# Patient Record
Sex: Female | Born: 1935 | Race: White | Hispanic: No | Smoking: Never smoker
Health system: Southern US, Community
[De-identification: ages and names within clinical notes are randomized; demographics above are authoritative.]

## PROBLEM LIST (undated history)

## (undated) DIAGNOSIS — I10 Essential (primary) hypertension: Secondary | ICD-10-CM

## (undated) HISTORY — PX: APPENDECTOMY: SHX54

---

## 2005-05-17 ENCOUNTER — Encounter: Admission: RE | Admit: 2005-05-17 | Discharge: 2005-05-17 | Payer: Self-pay | Admitting: Orthopedic Surgery

## 2005-07-03 ENCOUNTER — Encounter: Admission: RE | Admit: 2005-07-03 | Discharge: 2005-07-03 | Payer: Self-pay | Admitting: Orthopedic Surgery

## 2005-07-26 ENCOUNTER — Encounter: Admission: RE | Admit: 2005-07-26 | Discharge: 2005-07-26 | Payer: Self-pay | Admitting: Orthopedic Surgery

## 2005-08-09 ENCOUNTER — Encounter: Admission: RE | Admit: 2005-08-09 | Discharge: 2005-08-09 | Payer: Self-pay | Admitting: Orthopedic Surgery

## 2009-04-15 ENCOUNTER — Encounter: Admission: RE | Admit: 2009-04-15 | Discharge: 2009-04-15 | Payer: Self-pay | Admitting: Endocrinology

## 2014-11-24 DIAGNOSIS — H9113 Presbycusis, bilateral: Secondary | ICD-10-CM | POA: Diagnosis not present

## 2014-11-24 DIAGNOSIS — Z1211 Encounter for screening for malignant neoplasm of colon: Secondary | ICD-10-CM | POA: Diagnosis not present

## 2014-11-24 DIAGNOSIS — E780 Pure hypercholesterolemia: Secondary | ICD-10-CM | POA: Diagnosis not present

## 2014-11-24 DIAGNOSIS — Z79899 Other long term (current) drug therapy: Secondary | ICD-10-CM | POA: Diagnosis not present

## 2014-11-24 DIAGNOSIS — Z0000 Encounter for general adult medical examination without abnormal findings: Secondary | ICD-10-CM | POA: Diagnosis not present

## 2014-11-24 DIAGNOSIS — R1032 Left lower quadrant pain: Secondary | ICD-10-CM | POA: Diagnosis not present

## 2014-11-24 DIAGNOSIS — Z1389 Encounter for screening for other disorder: Secondary | ICD-10-CM | POA: Diagnosis not present

## 2014-11-24 DIAGNOSIS — R002 Palpitations: Secondary | ICD-10-CM | POA: Diagnosis not present

## 2014-11-24 DIAGNOSIS — I70219 Atherosclerosis of native arteries of extremities with intermittent claudication, unspecified extremity: Secondary | ICD-10-CM | POA: Diagnosis not present

## 2014-11-24 DIAGNOSIS — H8309 Labyrinthitis, unspecified ear: Secondary | ICD-10-CM | POA: Diagnosis not present

## 2014-11-24 DIAGNOSIS — R42 Dizziness and giddiness: Secondary | ICD-10-CM | POA: Diagnosis not present

## 2014-11-24 DIAGNOSIS — Z131 Encounter for screening for diabetes mellitus: Secondary | ICD-10-CM | POA: Diagnosis not present

## 2014-11-24 DIAGNOSIS — R5383 Other fatigue: Secondary | ICD-10-CM | POA: Diagnosis not present

## 2014-12-22 DIAGNOSIS — R319 Hematuria, unspecified: Secondary | ICD-10-CM | POA: Diagnosis not present

## 2014-12-22 DIAGNOSIS — I10 Essential (primary) hypertension: Secondary | ICD-10-CM | POA: Diagnosis not present

## 2015-08-04 ENCOUNTER — Ambulatory Visit
Admission: RE | Admit: 2015-08-04 | Discharge: 2015-08-04 | Disposition: A | Discharge status: Home / Self Care | Payer: Medicare Other | Source: Ambulatory Visit | Attending: Internal Medicine | Admitting: Internal Medicine

## 2015-08-04 ENCOUNTER — Other Ambulatory Visit: Payer: Self-pay | Admitting: Internal Medicine

## 2015-08-04 DIAGNOSIS — R0781 Pleurodynia: Secondary | ICD-10-CM

## 2015-08-04 DIAGNOSIS — R0789 Other chest pain: Secondary | ICD-10-CM

## 2015-11-29 ENCOUNTER — Other Ambulatory Visit (HOSPITAL_COMMUNITY): Payer: Self-pay | Admitting: Internal Medicine

## 2015-11-29 DIAGNOSIS — R0989 Other specified symptoms and signs involving the circulatory and respiratory systems: Secondary | ICD-10-CM

## 2015-11-30 ENCOUNTER — Ambulatory Visit (HOSPITAL_COMMUNITY)
Admission: RE | Admit: 2015-11-30 | Discharge: 2015-11-30 | Disposition: A | Discharge status: Home / Self Care | Payer: Medicare Other | Source: Ambulatory Visit | Attending: Internal Medicine | Admitting: Internal Medicine

## 2015-11-30 DIAGNOSIS — I6523 Occlusion and stenosis of bilateral carotid arteries: Secondary | ICD-10-CM | POA: Insufficient documentation

## 2015-11-30 DIAGNOSIS — R0989 Other specified symptoms and signs involving the circulatory and respiratory systems: Secondary | ICD-10-CM | POA: Insufficient documentation

## 2015-11-30 NOTE — Progress Notes (Signed)
*  PRELIMINARY RESULTS* Vascular Ultrasound Carotid Duplex (Doppler) has been completed.  There is no obvious evidence of hemodynamically significant internal carotid artery stenosis bilaterally. Vertebral arteries are patent with antegrade flow.  11/30/2015 2:32 PM Gertie Fey, RVT, RDCS, RDMS

## 2017-05-29 ENCOUNTER — Emergency Department (HOSPITAL_COMMUNITY): Payer: Medicare Other

## 2017-05-29 ENCOUNTER — Encounter (HOSPITAL_COMMUNITY): Payer: Self-pay | Admitting: *Deleted

## 2017-05-29 ENCOUNTER — Emergency Department (HOSPITAL_COMMUNITY)
Admission: EM | Admit: 2017-05-29 | Discharge: 2017-05-29 | Disposition: A | Discharge status: Home / Self Care | Payer: Medicare Other | Attending: Emergency Medicine | Admitting: Emergency Medicine

## 2017-05-29 DIAGNOSIS — R51 Headache: Secondary | ICD-10-CM | POA: Diagnosis not present

## 2017-05-29 DIAGNOSIS — W01190A Fall on same level from slipping, tripping and stumbling with subsequent striking against furniture, initial encounter: Secondary | ICD-10-CM | POA: Insufficient documentation

## 2017-05-29 DIAGNOSIS — Y939 Activity, unspecified: Secondary | ICD-10-CM | POA: Insufficient documentation

## 2017-05-29 DIAGNOSIS — M545 Low back pain: Secondary | ICD-10-CM | POA: Insufficient documentation

## 2017-05-29 DIAGNOSIS — S01312A Laceration without foreign body of left ear, initial encounter: Secondary | ICD-10-CM | POA: Diagnosis present

## 2017-05-29 DIAGNOSIS — Y92009 Unspecified place in unspecified non-institutional (private) residence as the place of occurrence of the external cause: Secondary | ICD-10-CM | POA: Insufficient documentation

## 2017-05-29 DIAGNOSIS — Y999 Unspecified external cause status: Secondary | ICD-10-CM | POA: Insufficient documentation

## 2017-05-29 DIAGNOSIS — W19XXXA Unspecified fall, initial encounter: Secondary | ICD-10-CM

## 2017-05-29 DIAGNOSIS — N3000 Acute cystitis without hematuria: Secondary | ICD-10-CM | POA: Diagnosis not present

## 2017-05-29 DIAGNOSIS — Z79899 Other long term (current) drug therapy: Secondary | ICD-10-CM | POA: Diagnosis not present

## 2017-05-29 LAB — CBC
HCT: 34.5 % — ABNORMAL LOW (ref 36.0–46.0)
Hemoglobin: 11.5 g/dL — ABNORMAL LOW (ref 12.0–15.0)
MCH: 30.5 pg (ref 26.0–34.0)
MCHC: 33.3 g/dL (ref 30.0–36.0)
MCV: 91.5 fL (ref 78.0–100.0)
Platelets: 210 10*3/uL (ref 150–400)
RBC: 3.77 MIL/uL — ABNORMAL LOW (ref 3.87–5.11)
RDW: 13.8 % (ref 11.5–15.5)
WBC: 16.1 10*3/uL — ABNORMAL HIGH (ref 4.0–10.5)

## 2017-05-29 LAB — BASIC METABOLIC PANEL
Anion gap: 8 (ref 5–15)
BUN: 28 mg/dL — ABNORMAL HIGH (ref 6–20)
CO2: 27 mmol/L (ref 22–32)
Calcium: 9.7 mg/dL (ref 8.9–10.3)
Chloride: 104 mmol/L (ref 101–111)
Creatinine, Ser: 0.94 mg/dL (ref 0.44–1.00)
GFR calc Af Amer: 59 mL/min — ABNORMAL LOW (ref >60)
GFR calc non Af Amer: 51 mL/min — ABNORMAL LOW (ref >60)
Glucose, Bld: 148 mg/dL — ABNORMAL HIGH (ref 65–99)
Potassium: 3.3 mmol/L — ABNORMAL LOW (ref 3.5–5.1)
Sodium: 139 mmol/L (ref 135–145)

## 2017-05-29 LAB — URINALYSIS, ROUTINE W REFLEX MICROSCOPIC
Bilirubin Urine: NEGATIVE
Glucose, UA: NEGATIVE mg/dL
Ketones, ur: NEGATIVE mg/dL
Nitrite: NEGATIVE
Protein, ur: >=300 mg/dL — AB
Specific Gravity, Urine: 1.017 (ref 1.005–1.030)
pH: 5 (ref 5.0–8.0)

## 2017-05-29 LAB — CK: Total CK: 2121 U/L — ABNORMAL HIGH (ref 38–234)

## 2017-05-29 MED ORDER — SODIUM CHLORIDE 0.9 % IV BOLUS (SEPSIS)
500.0000 mL | Freq: Once | INTRAVENOUS | Status: AC
  Administered 2017-05-29: 500 mL via INTRAVENOUS

## 2017-05-29 MED ORDER — CEPHALEXIN 500 MG PO CAPS: 500.0000 mg | ORAL_CAPSULE | Freq: Three times a day (TID) | ORAL | 0 refills | Status: DC

## 2017-05-29 MED ORDER — CEPHALEXIN 500 MG PO CAPS
500.0000 mg | ORAL_CAPSULE | Freq: Once | ORAL | Status: AC
  Administered 2017-05-29: 500 mg via ORAL
  Filled 2017-05-29: qty 1

## 2017-05-29 MED ORDER — DOXYCYCLINE HYCLATE 100 MG IV SOLR: 100.0000 mg | Freq: Once | INTRAVENOUS | Status: DC

## 2017-05-29 MED ORDER — LIDOCAINE HCL (PF) 1 % IJ SOLN
10.0000 mL | Freq: Once | INTRAMUSCULAR | Status: AC
  Administered 2017-05-29: 10 mL via INTRADERMAL
  Filled 2017-05-29: qty 30

## 2017-05-29 NOTE — ED Provider Notes (Signed)
[facility]-EMERGENCY DEPT Provider Note   CSN: [id number] Arrival date & time: 05/29/17  1317     History   Chief Complaint Chief Complaint  Patient presents with  . Fall    HPI Patricia Payne is a 83 y.o. female.  Patient with history of hypertension, accompanied by family today, with chief complaint of fall. Patient fell approximately midnight and struck her left ear on a TV stand. She states that she was too weak to get herself up. She lied on the floor on her right side for several hours. Patient states that typically she would be able to get herself up. She complains of lower back pain at the current time. She has dried blood from a cut on her left ear. Denies other acute complaints including chest pain, shortness of breath. No recent fevers, URI symptoms, vomiting, diarrhea. Family states that she was a bit more shaky than normal last night when they went out to eat. She also has been urinating more frequently recently. The onset of this condition was acute. The course is constant. Aggravating factors: none. Alleviating factors: none.        History reviewed. No pertinent past medical history.  There are no active problems to display for this patient.   History reviewed. No pertinent surgical history.  OB History    No data available       Home Medications    Prior to Admission medications   Medication Sig Start Date End Date Taking? Authorizing Provider  acetaminophen (TYLENOL) 500 MG tablet Take 1,000 mg by mouth every 6 (six) hours as needed for mild pain.   Yes [provider]  losartan (COZAAR) 25 MG tablet Take 25 mg by mouth daily. 05/09/17  Yes [provider]  Multiple Vitamin (MULTIVITAMIN WITH MINERALS) TABS tablet Take 2 tablets by mouth daily.   Yes [provider]  rOPINIRole (REQUIP) 1 MG tablet Take 1 mg by mouth at bedtime. 05/19/17  Yes [provider]  simvastatin (ZOCOR) 20 MG tablet Take 20 mg by mouth daily.  04/14/17  Yes [provider]  valsartan (DIOVAN) 320 MG tablet Take 320 mg by mouth daily. 03/15/17  Yes [provider]    Family History No family history on file.  Social History Social History  Substance Use Topics  . Smoking status: Never Smoker  . Smokeless tobacco: Never Used  . Alcohol use No     Allergies   Patient has no known allergies.   Review of Systems Review of Systems  Constitutional: Negative for fatigue and fever.  HENT: Negative for rhinorrhea, sore throat and tinnitus.   Eyes: Negative for photophobia, pain, redness and visual disturbance.  Respiratory: Negative for cough and shortness of breath.   Cardiovascular: Negative for chest pain.  Gastrointestinal: Negative for abdominal pain, diarrhea, nausea and vomiting.  Genitourinary: Positive for frequency. Negative for dysuria and hematuria.  Musculoskeletal: Positive for back pain. Negative for gait problem, myalgias and neck pain.  Skin: Positive for wound. Negative for rash.  Neurological: Positive for weakness (generalized). Negative for dizziness, light-headedness, numbness and headaches.  Psychiatric/Behavioral: Negative for confusion and decreased concentration.     Physical Exam Updated Vital Signs BP (!) 116/59 (BP Location: Right Arm)   Pulse 77   Temp 98.5 F (36.9 C) (Oral)   Resp 14   SpO2 93%   Physical Exam  Constitutional: She is oriented to person, place, and time. She appears well-developed and well-nourished.  HENT:  Head: Normocephalic.  Head is without raccoon's eyes and without Battle's sign.  Right Ear: Tympanic membrane, external ear and ear canal normal. No hemotympanum.  Left Ear: Tympanic membrane and ear canal normal. No hemotympanum.  Nose: Nose normal. No nasal septal hematoma.  Mouth/Throat: Uvula is midline, oropharynx is clear and moist and mucous membranes are normal.  Dried blood noted in left ear. Superficial laceration measuring about 2cm  noted overt antihelix. Partial avulsion. Base clean. Small area of laceration to cartilage but minor.   Eyes: Pupils are equal, round, and reactive to light. Conjunctivae, EOM and lids are normal. Right eye exhibits no discharge. Left eye exhibits no discharge. Right eye exhibits no nystagmus. Left eye exhibits no nystagmus.  No visible hyphema noted  Neck: Normal range of motion. Neck supple.  Cardiovascular: Normal rate, regular rhythm and normal heart sounds.   Pulmonary/Chest: Effort normal and breath sounds normal.  Abdominal: Soft. There is no tenderness.  Musculoskeletal:       Right shoulder: Normal.       Left shoulder: Normal.       Right elbow: Normal.      Left elbow: Normal.       Right wrist: Normal.       Left wrist: Normal.       Right hip: Normal.       Left hip: Normal.       Right knee: Normal.       Left knee: Normal.       Right ankle: Normal.       Left ankle: Normal.       Cervical back: She exhibits normal range of motion, no tenderness and no bony tenderness.       Thoracic back: She exhibits no tenderness and no bony tenderness.       Lumbar back: She exhibits tenderness. She exhibits no bony tenderness.  Neurological: She is alert and oriented to person, place, and time. She has normal strength and normal reflexes. No cranial nerve deficit or sensory deficit. Coordination normal. GCS eye subscore is 4. GCS verbal subscore is 5. GCS motor subscore is 6.  Skin: Skin is warm and dry.  Psychiatric: She has a normal mood and affect.  Nursing note and vitals reviewed.    ED Treatments / Results  Labs (all labs ordered are listed, but only abnormal results are displayed) Labs Reviewed  CBC - Abnormal; Notable for the following:       Result Value   WBC 16.1 (*)    RBC 3.77 (*)    Hemoglobin 11.5 (*)    HCT 34.5 (*)    All other components within normal limits  BASIC METABOLIC PANEL - Abnormal; Notable for the following:    Potassium 3.3 (*)    Glucose,  Bld 148 (*)    BUN 28 (*)    GFR calc non Af Amer 51 (*)    GFR calc Af Amer 59 (*)    All other components within normal limits  URINALYSIS, ROUTINE W REFLEX MICROSCOPIC - Abnormal; Notable for the following:    Color, Urine AMBER (*)    APPearance CLOUDY (*)    Hgb urine dipstick LARGE (*)    Protein, ur >=300 (*)    Leukocytes, UA LARGE (*)    Bacteria, UA MANY (*)    Squamous Epithelial / LPF 0-5 (*)    All other components within normal limits  CK - Abnormal; Notable for the following:    Total CK 2,121 (*)  All other components within normal limits  URINE CULTURE    EKG  EKG Interpretation None       Radiology Dg Lumbar Spine Complete  Result Date: 05/29/2017 CLINICAL DATA:  Fall. EXAM: LUMBAR SPINE - COMPLETE 4+ VIEW COMPARISON:  MRI 05/17/2005. FINDINGS: Lumbar vertebra numbered as per prior MRI. Diffuse multilevel degenerative change. 8 mm anterolisthesis L4 on L5 noted. Similar findings noted on prior study. Mild T12, L1, L2 compression fractures. Aortoiliac atherosclerotic vascular calcification. IMPRESSION: 1. Mild T12, L1, L2 compression fractures, age undetermined. These are new from prior MRI of 05/17/2005. 2. Diffuse multilevel degenerative change. 8 mm anterolisthesis L4 on L5 noted. Similar findings noted on prior study. 3. Aortic atherosclerotic vascular disease. Electronically Signed   By: Maisie Fus  Register   On: 05/29/2017 15:59   Ct Head Wo Contrast  Result Date: 05/29/2017 CLINICAL DATA:  Fall. EXAM: CT HEAD WITHOUT CONTRAST CT CERVICAL SPINE WITHOUT CONTRAST TECHNIQUE: Multidetector CT imaging of the head and cervical spine was performed following the standard protocol without intravenous contrast. Multiplanar CT image reconstructions of the cervical spine were also generated. COMPARISON:  None. FINDINGS: CT HEAD FINDINGS Brain: No evidence of acute infarction, hemorrhage, hydrocephalus, extra-axial collection or mass lesion/mass effect. Age-related cerebral  atrophy with compensatory dilatation of the ventricles. Mild periventricular white matter and corona radiata hypodensities favor chronic ischemic microvascular white matter disease. Vascular: No hyperdense vessel or unexpected calcification. Skull: Normal. Negative for fracture or focal lesion. Sinuses/Orbits: The bilateral paranasal sinuses and mastoid air cells are clear. Bilateral pseudophakia. The orbits otherwise unremarkable. Other: None. CT CERVICAL SPINE FINDINGS Alignment: Slightly accentuated normal cervical lordosis. Otherwise normal. Skull base and vertebrae: No acute fracture. No primary bone lesion or focal pathologic process. Soft tissues and spinal canal: No prevertebral fluid or swelling. No visible canal hematoma. Atherosclerotic vascular calcifications at the bilateral carotid bifurcations. Disc levels: Prominent atlantodental degenerative changes. Well corticated ossific density at the anterior base of the left C1 lateral mass is favored degenerative (series 11, image 41). Mild multilevel degenerative disc disease throughout the cervical spine, worst at C5-C6. Severe left-sided facet arthropathy. Mild uncovertebral hypertrophy at C5-C6. Upper chest: Small 3 mm ground-glass nodule in the left upper lobe otherwise negative. Other: None. IMPRESSION: 1.  No acute intracranial abnormality. 2. No acute cervical spine fracture. No prevertebral soft tissue swelling. 3. Mild multilevel degenerative disc disease and severe left-sided facet arthropathy throughout the cervical spine. Electronically Signed   By: Obie Dredge M.D.   On: 05/29/2017 16:37   Ct Cervical Spine Wo Contrast  Result Date: 05/29/2017 CLINICAL DATA:  Fall. EXAM: CT HEAD WITHOUT CONTRAST CT CERVICAL SPINE WITHOUT CONTRAST TECHNIQUE: Multidetector CT imaging of the head and cervical spine was performed following the standard protocol without intravenous contrast. Multiplanar CT image reconstructions of the cervical spine were also  generated. COMPARISON:  None. FINDINGS: CT HEAD FINDINGS Brain: No evidence of acute infarction, hemorrhage, hydrocephalus, extra-axial collection or mass lesion/mass effect. Age-related cerebral atrophy with compensatory dilatation of the ventricles. Mild periventricular white matter and corona radiata hypodensities favor chronic ischemic microvascular white matter disease. Vascular: No hyperdense vessel or unexpected calcification. Skull: Normal. Negative for fracture or focal lesion. Sinuses/Orbits: The bilateral paranasal sinuses and mastoid air cells are clear. Bilateral pseudophakia. The orbits otherwise unremarkable. Other: None. CT CERVICAL SPINE FINDINGS Alignment: Slightly accentuated normal cervical lordosis. Otherwise normal. Skull base and vertebrae: No acute fracture. No primary bone lesion or focal pathologic process. Soft tissues and spinal canal: No prevertebral fluid  or swelling. No visible canal hematoma. Atherosclerotic vascular calcifications at the bilateral carotid bifurcations. Disc levels: Prominent atlantodental degenerative changes. Well corticated ossific density at the anterior base of the left C1 lateral mass is favored degenerative (series 11, image 41). Mild multilevel degenerative disc disease throughout the cervical spine, worst at C5-C6. Severe left-sided facet arthropathy. Mild uncovertebral hypertrophy at C5-C6. Upper chest: Small 3 mm ground-glass nodule in the left upper lobe otherwise negative. Other: None. IMPRESSION: 1.  No acute intracranial abnormality. 2. No acute cervical spine fracture. No prevertebral soft tissue swelling. 3. Mild multilevel degenerative disc disease and severe left-sided facet arthropathy throughout the cervical spine. Electronically Signed   By: Obie Dredge M.D.   On: 05/29/2017 16:37    Procedures Procedures (including critical care time)  Medications Ordered in ED Medications - No data to display   Initial Impression / Assessment and  Plan / ED Course  I have reviewed the triage vital signs and the nursing notes.  Pertinent labs & imaging results that were available during my care of the patient were reviewed by me and considered in my medical decision making (see chart for details).     Patient seen and examined. Work-up initiated. D/w and seen by Dr. Clayborne Dana.   Vital signs reviewed and are as follows: BP (!) 116/59 (BP Location: Right Arm)   Pulse 77   Temp 98.5 F (36.9 C) (Oral)   Resp 14   SpO2 93%    Patient updated on results. Urine shows obvious urinary tract infection. Culture sent. Patient started on Keflex.  Left ear wound cleaned and evaluated. Due to tension on the laceration as well as thin skin, do not feel that suture repair will be possible. Wound reexamined with Dr. Clayborne Dana. After discussion with patient, decided that we will allow wound to heal by secondary intention.  Will discharge patient to home. Encouraged PCP follow-up in 3 days for urine and wound recheck. Patient counseled on wound care. Patient was urged to return to the Emergency Department urgently with worsening pain, swelling, expanding erythema especially if it streaks away from the affected area, fever, or if they have any other concerns. Patient verbalized understanding.   Encouraged patient to return to the emergency department with the symptoms or other concerns.  Final Clinical Impressions(s) / ED Diagnoses   Final diagnoses:  Acute cystitis without hematuria  Fall, initial encounter  Laceration of left ear, initial encounter   Patient with fall last evening. Head and neck imaging, lower back imaging are negative for acute injury. Patient has felt more weak over the past day. She has a urinary tract infection. She has an elevated CK, but less than 7000. She was hydrated in emergency department. No signs of acute kidney injury. Your laceration as described above. Do not feel that primary repair is indicated given technical  difficulties of wound repair along with time since injury.Will allow this to heal by secondary intention.  New Prescriptions New Prescriptions   CEPHALEXIN (KEFLEX) 500 MG CAPSULE    Take 1 capsule (500 mg total) by mouth 3 (three) times daily.     Renne Crigler, PA-C 05/29/17 2121    Marily Memos, MD 05/29/17 [phone]

## 2017-05-29 NOTE — ED Notes (Signed)
Patch and gauze applied to ear and secured with coban wrapped around pts head. Family instructed on dressing changes.

## 2017-05-29 NOTE — ED Provider Notes (Signed)
Medical screening examination/treatment/procedure(s) were conducted as a shared visit with non-physician practitioner(s) and myself.  I personally evaluated the patient during the encounter.  83 year old female who had a unwitnessed fall last night of uncertain etiology and today has some lower back pain and left head pain. On my exam she has of abrasion or small laceration to her left outer ear. No other trauma noticed on her head. Her back seemed to be tender in the lumbar area without any step-offs or deformities. Has not tried to walk since this episode but doesn't have any pain in her pelvis or legs that she knows of. Full muscle skeletal exam otherwise is unremarkable. Plan will be for CT scan and lumbar x-rays, weakness workup as she has had increasing weakness, shaking and polyuria recently. Possibly UTI versus dehydration and acute kidney injury. If everything is unremarkable patient is able to ambulate without difficulty she is stable for discharge home.    Marily Memos, MD 05/29/17 [phone]

## 2017-05-29 NOTE — Discharge Instructions (Signed)
Please read and follow all provided instructions.  Your diagnoses today include:  1. Acute cystitis without hematuria   2. Fall, initial encounter   3. Laceration of left ear, initial encounter     Tests performed today include:  Urine test - suggests that you have an infection in your bladder  Blood counts and electrolytes  CT scan of your head and neck - no serious injury  Vital signs. See below for your results today.   Medications prescribed:   Keflex (cephalexin) - antibiotic  You have been prescribed an antibiotic medicine: take the entire course of medicine even if you are feeling better. Stopping early can cause the antibiotic not to work.  Home care instructions:  Follow any educational materials contained in this packet.  Follow-up instructions: Please follow-up with your primary care provider in 3 days for recheck of your urine test and your ear laceration.  Return instructions:   Please return to the Emergency Department if you experience worsening symptoms.   Return with fever, worsening pain, persistent vomiting, worsening pain in your back.   Please return if you have any other emergent concerns.  Additional Information:  Your vital signs today were: BP (!) 185/68    Pulse 82    Temp 98.5 F (36.9 C) (Oral)    Resp (!) 21    SpO2 97%  If your blood pressure (BP) was elevated above 135/85 this visit, please have this repeated by your doctor within one month. --------------

## 2017-05-29 NOTE — ED Triage Notes (Signed)
Per EMS, pt fell ~midnight last night, hitting left ear on TV stand. Pt was unable to get off of floor. Pt is not on blood thinners, denies loss of consciousness. Pt has laceration to left ear.   BP 132/70 HR 70

## 2017-06-01 LAB — URINE CULTURE: Culture: >=100000 — AB

## 2017-06-02 ENCOUNTER — Telehealth: Payer: Self-pay

## 2017-06-02 NOTE — Telephone Encounter (Signed)
Post ED Visit - Positive Culture Follow-up  Culture report reviewed by antimicrobial stewardship pharmacist:  []  Enzo BiNathan Batchelder, Pharm.D. []  Celedonio MiyamotoJeremy Frens, Pharm.D., BCPS AQ-ID []  Garvin FilaMike Maccia, Pharm.D., BCPS []  Georgina PillionElizabeth Martin, Pharm.D., BCPS []  Silver FirsMinh Pham, VermontPharm.D., BCPS, AAHIVP []  Estella HuskMichelle Turner, Pharm.D., BCPS, AAHIVP []  Lysle Pearlachel Rumbarger, PharmD, BCPS []  Casilda Carlsaylor Stone, PharmD, BCPS []  Pollyann SamplesAndy Johnston, PharmD, BCPS [facility] Pharm D Positive urine culture Treated with Cephalexin, organism sensitive to the same and no further patient follow-up is required at this time.  Patricia Payne, Patricia Payne 06/02/2017, 11:13 AM

## 2017-08-26 ENCOUNTER — Emergency Department (HOSPITAL_COMMUNITY): Payer: Medicare Other

## 2017-08-26 ENCOUNTER — Other Ambulatory Visit: Payer: Self-pay

## 2017-08-26 ENCOUNTER — Encounter (HOSPITAL_COMMUNITY): Payer: Self-pay | Admitting: Emergency Medicine

## 2017-08-26 ENCOUNTER — Emergency Department (HOSPITAL_COMMUNITY)
Admission: EM | Admit: 2017-08-26 | Discharge: 2017-08-26 | Disposition: A | Discharge status: Home / Self Care | Payer: Medicare Other | Source: Home / Self Care | Attending: Emergency Medicine | Admitting: Emergency Medicine

## 2017-08-26 DIAGNOSIS — I10 Essential (primary) hypertension: Secondary | ICD-10-CM

## 2017-08-26 DIAGNOSIS — R531 Weakness: Secondary | ICD-10-CM

## 2017-08-26 DIAGNOSIS — M6281 Muscle weakness (generalized): Secondary | ICD-10-CM | POA: Insufficient documentation

## 2017-08-26 DIAGNOSIS — R0902 Hypoxemia: Secondary | ICD-10-CM | POA: Diagnosis not present

## 2017-08-26 DIAGNOSIS — Z79899 Other long term (current) drug therapy: Secondary | ICD-10-CM

## 2017-08-26 DIAGNOSIS — A419 Sepsis, unspecified organism: Secondary | ICD-10-CM | POA: Diagnosis not present

## 2017-08-26 HISTORY — DX: Essential (primary) hypertension: I10

## 2017-08-26 LAB — URINALYSIS, ROUTINE W REFLEX MICROSCOPIC
BACTERIA UA: NONE SEEN
Bilirubin Urine: NEGATIVE
Glucose, UA: 50 mg/dL — AB
Ketones, ur: 5 mg/dL — AB
Leukocytes, UA: NEGATIVE
Nitrite: NEGATIVE
PROTEIN: 100 mg/dL — AB
SPECIFIC GRAVITY, URINE: 1.018 (ref 1.005–1.030)
pH: 6 (ref 5.0–8.0)

## 2017-08-26 LAB — CBC
HEMATOCRIT: 33.4 % — AB (ref 36.0–46.0)
Hemoglobin: 10.7 g/dL — ABNORMAL LOW (ref 12.0–15.0)
MCH: 30 pg (ref 26.0–34.0)
MCHC: 32 g/dL (ref 30.0–36.0)
MCV: 93.6 fL (ref 78.0–100.0)
PLATELETS: 182 10*3/uL (ref 150–400)
RBC: 3.57 MIL/uL — AB (ref 3.87–5.11)
RDW: 13.9 % (ref 11.5–15.5)
WBC: 12 10*3/uL — AB (ref 4.0–10.5)

## 2017-08-26 LAB — COMPREHENSIVE METABOLIC PANEL
ALBUMIN: 3.9 g/dL (ref 3.5–5.0)
ALT: 28 U/L (ref 14–54)
ANION GAP: 10 (ref 5–15)
AST: 40 U/L (ref 15–41)
Alkaline Phosphatase: 90 U/L (ref 38–126)
BILIRUBIN TOTAL: 0.8 mg/dL (ref 0.3–1.2)
BUN: 20 mg/dL (ref 6–20)
CALCIUM: 9.4 mg/dL (ref 8.9–10.3)
CHLORIDE: 105 mmol/L (ref 101–111)
CO2: 26 mmol/L (ref 22–32)
Creatinine, Ser: 0.73 mg/dL (ref 0.44–1.00)
GFR calc non Af Amer: >60 mL/min (ref >60)
Glucose, Bld: 141 mg/dL — ABNORMAL HIGH (ref 65–99)
POTASSIUM: 3.1 mmol/L — AB (ref 3.5–5.1)
Sodium: 141 mmol/L (ref 135–145)
Total Protein: 7.7 g/dL (ref 6.5–8.1)

## 2017-08-26 LAB — INFLUENZA PANEL BY PCR (TYPE A & B)
INFLBPCR: NEGATIVE
Influenza A By PCR: NEGATIVE

## 2017-08-26 NOTE — ED Notes (Signed)
Bed: [facility]20 Expected date:  Expected time:  Means of arrival:  Comments: EMS 83 yo female daughter wants her evaluated for UTI

## 2017-08-26 NOTE — ED Notes (Signed)
[facility] call to transport the pt back home.

## 2017-08-26 NOTE — ED Notes (Signed)
Pt was unable to walk in room.  Pt was able to stand slouched over.

## 2017-08-26 NOTE — ED Triage Notes (Signed)
Patient BIB [facility] from home for weakness and possible UTI. Pt lives at home alone but sister stayed night with her due to increased weakness over last few days. Pt got up this am to go to bathroom, refused to go into bathroom and sat down on floor. Family unable to get pt up. Pt normally uses walker but unable to at this time. Pt confused at baseline per family.

## 2017-08-26 NOTE — ED Provider Notes (Signed)
[facility]-EMERGENCY DEPT Provider Note   CSN: [id number] Arrival date & time: 08/26/17  0347     History   Chief Complaint Chief Complaint  Patient presents with  . Weakness  . Urinary Tract Infection    HPI Ernst BreachMildred C Payne is a 83 y.o. female.  HPI Patient is a 83 year old female who is present with family who reports recurrent waxing and waning generalized weakness.  They state over the past 2 days she has had some increased weakness and today she sat on the floor and was unable to get up on her own.  She occasionally walks with a walker.  No reports of vomiting or diarrhea.  Appetite has been normal.  No fevers been noted.  Family is concerned about the possibility of a urinary tract infection.     Past Medical History:  Diagnosis Date  . Hypertension     There are no active problems to display for this patient.   Past Surgical History:  Procedure Laterality Date  . APPENDECTOMY      OB History    No data available       Home Medications    Prior to Admission medications   Medication Sig Start Date End Date Taking? Authorizing Provider  acetaminophen (TYLENOL) 500 MG tablet Take 1,000 mg by mouth every 6 (six) hours as needed for mild pain.   Yes [provider]  ALPRAZolam (XANAX) 0.25 MG tablet Take 0.125-0.25 mg by mouth 3 (three) times daily. 1/2 tablet for breakfast and lunch, 1 tablet for dinner 08/21/17  Yes [provider]  losartan (COZAAR) 25 MG tablet Take 25 mg by mouth daily. 05/09/17  Yes [provider]  rOPINIRole (REQUIP) 1 MG tablet Take 1 mg by mouth at bedtime. 05/19/17  Yes [provider]  simvastatin (ZOCOR) 20 MG tablet Take 20 mg by mouth daily. 04/14/17  Yes [provider]  traMADol (ULTRAM) 50 MG tablet Take 50 mg by mouth 2 (two) times daily. 08/21/17  Yes [provider]  valsartan (DIOVAN) 320 MG tablet Take 320 mg by mouth daily. 03/15/17  Yes [provider]  cephALEXin (KEFLEX) 500 MG capsule Take 1 capsule (500 mg total) by mouth 3 (three) times daily. Patient not taking: Reported on 08/26/2017 05/29/17   Renne CriglerGeiple, Joshua, PA-C    Family History History reviewed. No pertinent family history.  Social History Social History   Tobacco Use  . Smoking status: Never Smoker  . Smokeless tobacco: Never Used  Substance Use Topics  . Alcohol use: No  . Drug use: No     Allergies   Patient has no known allergies.   Review of Systems Review of Systems  All other systems reviewed and are negative.    Physical Exam Updated Vital Signs BP (!) 204/82   Pulse (!) 49   Temp 98.6 F (37 C) (Oral)   Resp 18   SpO2 98%   Physical Exam  Constitutional: She is oriented to person, place, and time. She appears well-developed and well-nourished. No distress.  HENT:  Head: Normocephalic and atraumatic.  Eyes: EOM are normal.  Neck: Normal range of motion.  Cardiovascular: Normal rate and regular rhythm.  Pulmonary/Chest: Effort normal and breath sounds normal.  Abdominal: Soft. She exhibits no distension. There is no tenderness.  Musculoskeletal: Normal range of motion. She exhibits no edema or tenderness.  Neurological: She is alert and oriented to person, place, and time.  Normal motor strength in bilateral  upper and lower extremity major muscle groups  Skin: Skin is warm and dry.  Psychiatric: She has a normal mood and affect. Judgment normal.  Nursing note and vitals reviewed.    ED Treatments / Results  Labs (all labs ordered are listed, but only abnormal results are displayed) Labs Reviewed  URINALYSIS, ROUTINE W REFLEX MICROSCOPIC - Abnormal; Notable for the following components:      Result Value   Glucose, UA 50 (*)    Hgb urine dipstick SMALL (*)    Ketones, ur 5 (*)    Protein, ur 100 (*)    Squamous Epithelial / LPF 0-5 (*)    All other components within normal limits  CBC - Abnormal; Notable for the  following components:   WBC 12.0 (*)    RBC 3.57 (*)    Hemoglobin 10.7 (*)    HCT 33.4 (*)    All other components within normal limits  COMPREHENSIVE METABOLIC PANEL - Abnormal; Notable for the following components:   Potassium 3.1 (*)    Glucose, Bld 141 (*)    All other components within normal limits  INFLUENZA PANEL BY PCR (TYPE A & B)    EKG  EKG Interpretation None       Radiology Dg Chest 2 View  Result Date: 08/26/2017 CLINICAL DATA:  Weakness. EXAM: CHEST  2 VIEW COMPARISON:  08/04/2015 FINDINGS: Shallow inspiration with linear atelectasis in the lung bases. Cardiac enlargement with diffuse pulmonary vascular congestion. Perihilar hazy infiltrates consistent with edema. No focal consolidation. No blunting of costophrenic angles. No pneumothorax. Degenerative changes in the spine. Calcification of the aorta. IMPRESSION: Shallow inspiration with atelectasis in the lung bases. Congestive changes with cardiac enlargement, pulmonary vascular congestion, and perihilar edema. Electronically Signed   By: Burman Nieves M.D.   On: 08/26/2017 05:42    Procedures Procedures (including critical care time)  Medications Ordered in ED Medications - No data to display   Initial Impression / Assessment and Plan / ED Course  I have reviewed the triage vital signs and the nursing notes.  Pertinent labs & imaging results that were available during my care of the patient were reviewed by me and considered in my medical decision making (see chart for details).     No significant abnormalities noted.  It sounds that the patient has had waxing and waning issues with ambulation.  Sometimes she walks with a rolling walker sometimes she does not.  I think the patient will benefit from home health resources.  Consult has been placed electronically to case management to reach out to the family.  I have done the face-to-face examination necessary for home health PT, OT, RN, aide, social  worker.  She may end up benefiting from a short stay at rehab and/or assisted living.  This will be best assessed as an outpatient in the home setting by the home health team's.  Labs and urine without significant abnormality.  Chest x-ray without pneumonia.  Plan for discharge home in good condition.  Family is currently staying with her to provide the necessary help at home  Final Clinical Impressions(s) / ED Diagnoses   Final diagnoses:  Generalized weakness    ED Discharge Orders        Ordered    Home Health     08/26/17 0710    Face-to-face encounter (required for Medicare/Medicaid patients)    Comments:  I Azalia Bilis certify that this patient is under my care and that I, or a  nurse practitioner or physician's assistant working with me, had a face-to-face encounter that meets the physician face-to-face encounter requirements with this patient on 08/26/2017. The encounter with the patient was in whole, or in part for the following medical condition(s) which is the primary reason for home health care (List medical condition): increasing weakness and fallls. gait instability   08/26/17 0710       Azalia Bilisampos, Keali Mccraw, MD 08/26/17 [phone]

## 2017-08-27 ENCOUNTER — Encounter (HOSPITAL_COMMUNITY): Payer: Self-pay | Admitting: Emergency Medicine

## 2017-08-27 ENCOUNTER — Inpatient Hospital Stay (HOSPITAL_COMMUNITY)
Admission: EM | Admit: 2017-08-27 | Discharge: 2017-09-03 | DRG: 871 | Disposition: A | Discharge status: Skilled Nursing Facility | Payer: Medicare Other | Attending: Internal Medicine | Admitting: Internal Medicine

## 2017-08-27 ENCOUNTER — Emergency Department (HOSPITAL_COMMUNITY): Payer: Medicare Other

## 2017-08-27 ENCOUNTER — Other Ambulatory Visit: Payer: Self-pay

## 2017-08-27 ENCOUNTER — Telehealth: Payer: Self-pay | Admitting: Emergency Medicine

## 2017-08-27 DIAGNOSIS — J189 Pneumonia, unspecified organism: Secondary | ICD-10-CM | POA: Diagnosis present

## 2017-08-27 DIAGNOSIS — A419 Sepsis, unspecified organism: Principal | ICD-10-CM | POA: Diagnosis present

## 2017-08-27 DIAGNOSIS — I6523 Occlusion and stenosis of bilateral carotid arteries: Secondary | ICD-10-CM | POA: Diagnosis present

## 2017-08-27 DIAGNOSIS — G2581 Restless legs syndrome: Secondary | ICD-10-CM | POA: Diagnosis present

## 2017-08-27 DIAGNOSIS — I447 Left bundle-branch block, unspecified: Secondary | ICD-10-CM | POA: Diagnosis present

## 2017-08-27 DIAGNOSIS — E876 Hypokalemia: Secondary | ICD-10-CM

## 2017-08-27 DIAGNOSIS — D649 Anemia, unspecified: Secondary | ICD-10-CM | POA: Diagnosis present

## 2017-08-27 DIAGNOSIS — R0902 Hypoxemia: Secondary | ICD-10-CM

## 2017-08-27 DIAGNOSIS — M549 Dorsalgia, unspecified: Secondary | ICD-10-CM

## 2017-08-27 DIAGNOSIS — Z66 Do not resuscitate: Secondary | ICD-10-CM | POA: Diagnosis present

## 2017-08-27 DIAGNOSIS — G9341 Metabolic encephalopathy: Secondary | ICD-10-CM | POA: Diagnosis present

## 2017-08-27 DIAGNOSIS — Z8249 Family history of ischemic heart disease and other diseases of the circulatory system: Secondary | ICD-10-CM

## 2017-08-27 DIAGNOSIS — E785 Hyperlipidemia, unspecified: Secondary | ICD-10-CM | POA: Diagnosis present

## 2017-08-27 DIAGNOSIS — I10 Essential (primary) hypertension: Secondary | ICD-10-CM | POA: Diagnosis present

## 2017-08-27 DIAGNOSIS — G9340 Encephalopathy, unspecified: Secondary | ICD-10-CM | POA: Diagnosis present

## 2017-08-27 DIAGNOSIS — K5909 Other constipation: Secondary | ICD-10-CM | POA: Diagnosis present

## 2017-08-27 DIAGNOSIS — R4182 Altered mental status, unspecified: Secondary | ICD-10-CM

## 2017-08-27 DIAGNOSIS — R627 Adult failure to thrive: Secondary | ICD-10-CM | POA: Diagnosis present

## 2017-08-27 DIAGNOSIS — I4891 Unspecified atrial fibrillation: Secondary | ICD-10-CM

## 2017-08-27 DIAGNOSIS — Z79891 Long term (current) use of opiate analgesic: Secondary | ICD-10-CM

## 2017-08-27 DIAGNOSIS — I480 Paroxysmal atrial fibrillation: Secondary | ICD-10-CM | POA: Diagnosis present

## 2017-08-27 DIAGNOSIS — F419 Anxiety disorder, unspecified: Secondary | ICD-10-CM | POA: Diagnosis present

## 2017-08-27 DIAGNOSIS — W19XXXA Unspecified fall, initial encounter: Secondary | ICD-10-CM

## 2017-08-27 LAB — DIFFERENTIAL
Basophils Absolute: 0 10*3/uL (ref 0.0–0.1)
Basophils Relative: 0 %
Eosinophils Absolute: 0 10*3/uL (ref 0.0–0.7)
Eosinophils Relative: 0 %
LYMPHS PCT: 12 %
Lymphs Abs: 1.3 10*3/uL (ref 0.7–4.0)
MONO ABS: 1.5 10*3/uL — AB (ref 0.1–1.0)
Monocytes Relative: 14 %
NEUTROS ABS: 8.3 10*3/uL — AB (ref 1.7–7.7)
NEUTROS PCT: 74 %

## 2017-08-27 LAB — COMPREHENSIVE METABOLIC PANEL
ALBUMIN: 2.6 g/dL — AB (ref 3.5–5.0)
ALK PHOS: 80 U/L (ref 38–126)
ALT: 23 U/L (ref 14–54)
AST: 49 U/L — AB (ref 15–41)
Anion gap: 10 (ref 5–15)
BUN: 17 mg/dL (ref 6–20)
CALCIUM: 8.8 mg/dL — AB (ref 8.9–10.3)
CO2: 24 mmol/L (ref 22–32)
CREATININE: 0.72 mg/dL (ref 0.44–1.00)
Chloride: 102 mmol/L (ref 101–111)
GFR calc Af Amer: >60 mL/min (ref >60)
GFR calc non Af Amer: >60 mL/min (ref >60)
GLUCOSE: 126 mg/dL — AB (ref 65–99)
Potassium: 3.9 mmol/L (ref 3.5–5.1)
SODIUM: 136 mmol/L (ref 135–145)
Total Bilirubin: 2.2 mg/dL — ABNORMAL HIGH (ref 0.3–1.2)
Total Protein: 6.1 g/dL — ABNORMAL LOW (ref 6.5–8.1)

## 2017-08-27 LAB — URINALYSIS, ROUTINE W REFLEX MICROSCOPIC
Bilirubin Urine: NEGATIVE
GLUCOSE, UA: NEGATIVE mg/dL
Ketones, ur: 5 mg/dL — AB
LEUKOCYTES UA: NEGATIVE
Nitrite: NEGATIVE
SPECIFIC GRAVITY, URINE: 1.02 (ref 1.005–1.030)
pH: 6 (ref 5.0–8.0)

## 2017-08-27 LAB — I-STAT CHEM 8, ED
BUN: 23 mg/dL — ABNORMAL HIGH (ref 6–20)
CREATININE: 0.6 mg/dL (ref 0.44–1.00)
Calcium, Ion: 1.12 mmol/L — ABNORMAL LOW (ref 1.15–1.40)
Chloride: 103 mmol/L (ref 101–111)
Glucose, Bld: 130 mg/dL — ABNORMAL HIGH (ref 65–99)
HEMATOCRIT: 35 % — AB (ref 36.0–46.0)
HEMOGLOBIN: 11.9 g/dL — AB (ref 12.0–15.0)
POTASSIUM: 3.9 mmol/L (ref 3.5–5.1)
Sodium: 139 mmol/L (ref 135–145)
TCO2: 29 mmol/L (ref 22–32)

## 2017-08-27 LAB — CBC
HCT: 34.8 % — ABNORMAL LOW (ref 36.0–46.0)
HEMATOCRIT: 29.8 % — AB (ref 36.0–46.0)
HEMOGLOBIN: 9.9 g/dL — AB (ref 12.0–15.0)
Hemoglobin: 11.6 g/dL — ABNORMAL LOW (ref 12.0–15.0)
MCH: 30.5 pg (ref 26.0–34.0)
MCH: 30.4 pg (ref 26.0–34.0)
MCHC: 33.3 g/dL (ref 30.0–36.0)
MCHC: 33.2 g/dL (ref 30.0–36.0)
MCV: 91.6 fL (ref 78.0–100.0)
MCV: 91.4 fL (ref 78.0–100.0)
PLATELETS: 205 10*3/uL (ref 150–400)
Platelets: 180 10*3/uL (ref 150–400)
RBC: 3.8 MIL/uL — AB (ref 3.87–5.11)
RBC: 3.26 MIL/uL — AB (ref 3.87–5.11)
RDW: 13.9 % (ref 11.5–15.5)
RDW: 13.7 % (ref 11.5–15.5)
WBC: 11.1 10*3/uL — AB (ref 4.0–10.5)
WBC: 9.7 10*3/uL (ref 4.0–10.5)

## 2017-08-27 LAB — CREATININE, SERUM: Creatinine, Ser: 0.82 mg/dL (ref 0.44–1.00)

## 2017-08-27 LAB — I-STAT TROPONIN, ED: Troponin i, poc: 0.11 ng/mL (ref 0.00–0.08)

## 2017-08-27 LAB — PROTIME-INR
INR: 1.19
PROTHROMBIN TIME: 15 s (ref 11.4–15.2)

## 2017-08-27 LAB — AMMONIA: AMMONIA: 24 umol/L (ref 9–35)

## 2017-08-27 LAB — APTT: aPTT: 27 seconds (ref 24–36)

## 2017-08-27 LAB — I-STAT CG4 LACTIC ACID, ED: LACTIC ACID, VENOUS: 1.4 mmol/L (ref 0.5–1.9)

## 2017-08-27 LAB — BRAIN NATRIURETIC PEPTIDE: B Natriuretic Peptide: 555.4 pg/mL — ABNORMAL HIGH (ref 0.0–100.0)

## 2017-08-27 LAB — ETHANOL: Alcohol, Ethyl (B): <10 mg/dL (ref <10)

## 2017-08-27 MED ORDER — ACETAMINOPHEN 650 MG RE SUPP: 650.0000 mg | RECTAL | Status: DC | PRN

## 2017-08-27 MED ORDER — STROKE: EARLY STAGES OF RECOVERY BOOK
Freq: Once | Status: AC
  Administered 2017-08-28: 03:00:00
  Filled 2017-08-27: qty 1

## 2017-08-27 MED ORDER — SODIUM CHLORIDE 0.9 % IV SOLN
INTRAVENOUS | Status: DC
  Administered 2017-08-27: 22:00:00 via INTRAVENOUS

## 2017-08-27 MED ORDER — ACETAMINOPHEN 160 MG/5ML PO SOLN: 650.0000 mg | ORAL | Status: DC | PRN

## 2017-08-27 MED ORDER — DEXTROSE 5 % IV SOLN
1.0000 g | Freq: Once | INTRAVENOUS | Status: AC
  Administered 2017-08-27: 1 g via INTRAVENOUS
  Filled 2017-08-27: qty 10

## 2017-08-27 MED ORDER — ACETAMINOPHEN 325 MG PO TABS
650.0000 mg | ORAL_TABLET | ORAL | Status: DC | PRN
  Administered 2017-09-03: 650 mg via ORAL
  Filled 2017-08-27: qty 2

## 2017-08-27 MED ORDER — ENOXAPARIN SODIUM 40 MG/0.4ML ~~LOC~~ SOLN: 40.0000 mg | Freq: Every day | SUBCUTANEOUS | Status: DC

## 2017-08-27 MED ORDER — DOXYCYCLINE HYCLATE 100 MG IV SOLR
100.0000 mg | Freq: Once | INTRAVENOUS | Status: AC
  Administered 2017-08-27: 100 mg via INTRAVENOUS
  Filled 2017-08-27: qty 100

## 2017-08-27 MED ORDER — HYDRALAZINE HCL 20 MG/ML IJ SOLN: 10.0000 mg | INTRAMUSCULAR | Status: DC | PRN

## 2017-08-27 MED ORDER — AZITHROMYCIN 500 MG IV SOLR
500.0000 mg | INTRAVENOUS | Status: DC
  Administered 2017-08-28 – 2017-08-29 (×3): 500 mg via INTRAVENOUS
  Filled 2017-08-27 (×4): qty 500

## 2017-08-27 MED ORDER — DEXTROSE 5 % IV SOLN
1.0000 g | INTRAVENOUS | Status: DC
  Administered 2017-08-28 – 2017-09-01 (×5): 1 g via INTRAVENOUS
  Filled 2017-08-27 (×8): qty 10

## 2017-08-27 NOTE — ED Provider Notes (Addendum)
[facility] Provider Note   CSN: [id number] Arrival date & time: 08/27/17  1745     History   Chief Complaint Chief Complaint  Patient presents with  . Altered Mental Status    HPI Patricia Payne is a 83 y.o. female.  83 year old female with history of hypertension who presents with generalized weakness and altered mental status.  [facility] for generalized weakness particularly in her legs.  They were concerned about the possibility of a urinary tract infection but her workup yesterday was normal.  She was discharged home and they state that she has become progressively more lethargic, confused, and with slurred speech.  She has had increased falls because of her generalized weakness and has not been able to stand up on her own.  They have noted a mild occasional cough, no known fevers.  They felt like she looked short of breath last night but this eventually resolved spontaneously.  She has not had any vomiting.  They note that she is normally oriented x3 and conversant. She has occasionally complained of back pain but this is a chronic problem for her.  LEVEL 5 CAVEAT DUE TO AMS   The history is provided by a relative.  Altered Mental Status      Past Medical History:  Diagnosis Date  . Hypertension     There are no active problems to display for this patient.   Past Surgical History:  Procedure Laterality Date  . APPENDECTOMY      OB History    No data available       Home Medications    Prior to Admission medications   Medication Sig Start Date End Date Taking? Authorizing Provider  acetaminophen (TYLENOL) 500 MG tablet Take 1,000 mg by mouth every 6 (six) hours as needed for mild pain.   Yes [provider]  ALPRAZolam (XANAX) 0.25 MG tablet Take 0.125-0.25 mg by mouth See admin instructions. 0.125mg  in morning and afternoon, then 0.25mg  at  bedtime 08/21/17  Yes [provider]  losartan (COZAAR) 25 MG tablet Take 25 mg by mouth daily. 05/09/17  Yes [provider]  rOPINIRole (REQUIP) 1 MG tablet Take 1 mg by mouth at bedtime. 05/19/17  Yes [provider]  simvastatin (ZOCOR) 20 MG tablet Take 20 mg by mouth daily. 04/14/17  Yes [provider]  traMADol (ULTRAM) 50 MG tablet Take 50 mg by mouth 2 (two) times daily. 08/21/17  Yes [provider]  valsartan (DIOVAN) 320 MG tablet Take 320 mg by mouth daily. 03/15/17  Yes [provider]  cephALEXin (KEFLEX) 500 MG capsule Take 1 capsule (500 mg total) by mouth 3 (three) times daily. Patient not taking: Reported on 08/26/2017 05/29/17   Renne CriglerGeiple, Joshua, PA-C    Family History History reviewed. No pertinent family history.  Social History Social History   Tobacco Use  . Smoking status: Never Smoker  . Smokeless tobacco: Never Used  Substance Use Topics  . Alcohol use: No  . Drug use: No     Allergies   Patient has no known allergies.   Review of Systems Review of Systems  Unable to perform ROS: Mental status change    Physical Exam Updated Vital Signs BP (!) 130/43   Pulse 71   Resp (!) 34   Ht 5\' 4"  (1.626 m)   Wt 61.2 kg (135 lb)   SpO2 99%   BMI  23.17 kg/m   Physical Exam  Constitutional: She appears well-developed and well-nourished.  Altered, ill appearing but non-toxic  HENT:  Head: Normocephalic and atraumatic.  dry mucous membranes  Eyes: Conjunctivae are normal. Pupils are equal, round, and reactive to light.  Neck: Neck supple.  Cardiovascular: Normal rate, regular rhythm and normal heart sounds.  No murmur heard. Pulmonary/Chest: Effort normal.  Diminished b/l with crackles in LLL  Abdominal: Soft. Bowel sounds are normal. She exhibits no distension. There is no tenderness.  Musculoskeletal: She exhibits no edema.  Neurological: She is alert.  Disoriented, able to follow a few basic  commands but sleepy  Skin: Skin is warm and dry.  Nursing note and vitals reviewed.    ED Treatments / Results  Labs (all labs ordered are listed, but only abnormal results are displayed) Labs Reviewed  CBC - Abnormal; Notable for the following components:      Result Value   WBC 11.1 (*)    RBC 3.80 (*)    Hemoglobin 11.6 (*)    HCT 34.8 (*)    All other components within normal limits  DIFFERENTIAL - Abnormal; Notable for the following components:   Neutro Abs 8.3 (*)    Monocytes Absolute 1.5 (*)    All other components within normal limits  COMPREHENSIVE METABOLIC PANEL - Abnormal; Notable for the following components:   Glucose, Bld 126 (*)    Calcium 8.8 (*)    Total Protein 6.1 (*)    Albumin 2.6 (*)    AST 49 (*)    Total Bilirubin 2.2 (*)    All other components within normal limits  I-STAT CHEM 8, ED - Abnormal; Notable for the following components:   BUN 23 (*)    Glucose, Bld 130 (*)    Calcium, Ion 1.12 (*)    Hemoglobin 11.9 (*)    HCT 35.0 (*)    All other components within normal limits  I-STAT TROPONIN, ED - Abnormal; Notable for the following components:   Troponin i, poc 0.11 (*)    All other components within normal limits  URINE CULTURE  ETHANOL  PROTIME-INR  APTT  URINALYSIS, ROUTINE W REFLEX MICROSCOPIC  BRAIN NATRIURETIC PEPTIDE  I-STAT CG4 LACTIC ACID, ED  I-STAT CG4 LACTIC ACID, ED    EKG  EKG Interpretation None       Radiology Dg Chest 2 View  Result Date: 08/26/2017 CLINICAL DATA:  Weakness. EXAM: CHEST  2 VIEW COMPARISON:  08/04/2015 FINDINGS: Shallow inspiration with linear atelectasis in the lung bases. Cardiac enlargement with diffuse pulmonary vascular congestion. Perihilar hazy infiltrates consistent with edema. No focal consolidation. No blunting of costophrenic angles. No pneumothorax. Degenerative changes in the spine. Calcification of the aorta. IMPRESSION: Shallow inspiration with atelectasis in the lung bases.  Congestive changes with cardiac enlargement, pulmonary vascular congestion, and perihilar edema. Electronically Signed   By: Burman NievesWilliam  Stevens M.D.   On: 08/26/2017 05:42   Ct Head Wo Contrast  Result Date: 08/27/2017 CLINICAL DATA:  UTI.  Mental status changes. EXAM: CT HEAD WITHOUT CONTRAST TECHNIQUE: Contiguous axial images were obtained from the base of the skull through the vertex without intravenous contrast. COMPARISON:  05/29/2017 FINDINGS: Brain: There is no evidence for acute hemorrhage, hydrocephalus, mass lesion, or abnormal extra-axial fluid collection. No definite CT evidence for acute infarction. Diffuse loss of parenchymal volume is consistent with atrophy. Patchy low attenuation in the deep hemispheric and periventricular white matter is nonspecific, but likely reflects chronic microvascular ischemic demyelination.  Vascular: No hyperdense vessel or unexpected calcification. Skull: No evidence for fracture. No worrisome lytic or sclerotic lesion. Visualized Sinuses/Orbits: The visualized paranasal sinuses and mastoid air cells are clear. Visualized portions of the globes and intraorbital fat are unremarkable. Other: None. IMPRESSION: 1. No acute intracranial abnormality. 2. Atrophy with chronic small vessel white matter ischemic disease. Electronically Signed   By: Kennith Center M.D.   On: 08/27/2017 18:59   Dg Chest Port 1 View  Result Date: 08/27/2017 CLINICAL DATA:  83 y/o  F; hypoxia. EXAM: PORTABLE CHEST 1 VIEW COMPARISON:  08/26/2017 chest radiograph FINDINGS: Normal cardiac silhouette. Aortic atherosclerosis with calcification. These hazy opacification of the lungs and prominent reticular markings. No pleural effusion or pneumothorax. Multilevel degenerative changes of the spine. No acute osseous abnormality identified. IMPRESSION: Diffuse hazy opacification of lungs and reticular markings probably representing pulmonary edema. Underlying pneumonia not excluded. Electronically Signed    By: Mitzi Hansen M.D.   On: 08/27/2017 20:03    Procedures Procedures (including critical care time)  Medications Ordered in ED Medications  cefTRIAXone (ROCEPHIN) 1 g in dextrose 5 % 50 mL IVPB (1 g Intravenous New Bag/Given 08/27/17 2054)  doxycycline (VIBRAMYCIN) 100 mg in dextrose 5 % 250 mL IVPB (not administered)     Initial Impression / Assessment and Plan / ED Course  I have reviewed the triage vital signs and the nursing notes.  Pertinent labs & imaging results that were available during my care of the patient were reviewed by me and considered in my medical decision making (see chart for details).    PT w/ AMS and progressive generalized weakness. She was 89% on RA, placed on O2 via [place]. No abd tenderness or distension.  Obtain above labs as well as CT of head, chest x-ray as ddx includes PNA, intracranial hemorrhage or stroke.  Trop 0.11, EKG without significant changes compared to previous.  Lactate normal, WBC 11.1, Hgb 11.6, reassuring CMP. UA still pending but UA from yesterday was unremarkable. CXR shows diffuse bilateral opacifications, probably pulmonary edema but differential includes pneumonia.  Because of the patient's altered mentation, I am suspicious of an infectious process and have given ceftriaxone and doxycycline. I have added BNP. No h/o CHF although weakly positive trop may indicate mild heart strain from volume overload. EKG without acute ischemic changes compared to previous. Because altered mentation and suspicion of infectious process, I elected not to start heparin at this time but rather to trend trop for now.    She remained stable on oxygen via nasal cannula.  CT negative acute.  Discussed admission with hospitalist, Dr. Toniann Fail, and pt admitted for further work up.   Final Clinical Impressions(s) / ED Diagnoses   Final diagnoses:  Altered mental status, unspecified altered mental status type  Hypoxia    ED Discharge Orders    None        Neely Kammerer, Ambrose Finland, MD 08/27/17 2235    Clarene Duke Ambrose Finland, MD 08/27/17 2236

## 2017-08-27 NOTE — Care Management Note (Addendum)
Case Management Note  Patient Details  Name: Patricia Payne MRN: [id number] Date of Birth: 11/22/1922  Expected Discharge Date:   08/26/2017               Expected Discharge Plan:  Home w Home Health Services  Discharge planning Services  CM Consult  Post Acute Care Choice:  Home Health Choice offered to:  Spouse  HH Arranged:  RN, PT, OT, Nurse's Aide, Social Work [facility]  Status of Service:  Completed, signed off  Rica KoyanagiKritzer, Jamesia Linnen N, RN 08/27/2017, 11:06 AM

## 2017-08-27 NOTE — Telephone Encounter (Addendum)
CM consulted for HHS.  Spoke with Raynelle FanningJulie, pt's niece, who was at pt's home.  Pt is unable to speak due to recent changes and was being fed by pt's sister, Julie's mom, who has also been staying with her at night.  The family has used [facility] in the past and would like to use them again if possible.  CM also spoke with Raynelle FanningJulie about Medicare/Medicaid/private duty etc.  Advised the HHS can assist with needs and also to possibly consult with an elder law attorney.  Contacted [facility] who could no accept the pt for services.  They would not be able to start until the middle or the end of December.  Contacted [facility] who could not accept pt.  Spoke with Crystal with [facility] who accepted pt.  Updated Raynelle FanningJulie. No further CM needs noted at this time.

## 2017-08-27 NOTE — ED Triage Notes (Addendum)
Per EMS, pt from home, lives alone, pt's sister at bedside, pt's niece/caretaker reported increasing AMS and lethargy with slurred speech.  [facility] and several falls with generalized weakness.  Was sent home, negative for UTI.  Pt is A&Ox 4

## 2017-08-27 NOTE — ED Notes (Signed)
Elevated trop reported to Dr. Clarene DukeLittle

## 2017-08-27 NOTE — Telephone Encounter (Deleted)
[facility] was unable to accept pt.  Contacted Crystal with [facility] who can start services sooner.   No further CM needs noted at this time.

## 2017-08-28 ENCOUNTER — Other Ambulatory Visit (HOSPITAL_COMMUNITY): Payer: Medicare Other

## 2017-08-28 ENCOUNTER — Observation Stay (HOSPITAL_COMMUNITY): Payer: Medicare Other

## 2017-08-28 ENCOUNTER — Other Ambulatory Visit: Payer: Self-pay

## 2017-08-28 DIAGNOSIS — I4891 Unspecified atrial fibrillation: Secondary | ICD-10-CM

## 2017-08-28 DIAGNOSIS — E876 Hypokalemia: Secondary | ICD-10-CM

## 2017-08-28 DIAGNOSIS — G9340 Encephalopathy, unspecified: Secondary | ICD-10-CM | POA: Diagnosis not present

## 2017-08-28 LAB — TROPONIN I
TROPONIN I: 0.07 ng/mL — AB (ref <0.03)
Troponin I: 0.06 ng/mL (ref <0.03)
Troponin I: 0.42 ng/mL (ref <0.03)

## 2017-08-28 LAB — BASIC METABOLIC PANEL
Anion gap: 10 (ref 5–15)
BUN: 24 mg/dL — AB (ref 6–20)
CALCIUM: 8.4 mg/dL — AB (ref 8.9–10.3)
CO2: 22 mmol/L (ref 22–32)
CREATININE: 0.93 mg/dL (ref 0.44–1.00)
CREATININE: 0.92 mg/dL (ref 0.44–1.00)
Chloride: 106 mmol/L (ref 101–111)
GFR calc Af Amer: 60 mL/min — ABNORMAL LOW (ref >60)
GFR calc Af Amer: >60 mL/min (ref >60)
GFR calc non Af Amer: 52 mL/min — ABNORMAL LOW (ref >60)
GFR, EST NON AFRICAN AMERICAN: 52 mL/min — AB (ref >60)
GLUCOSE: 134 mg/dL — AB (ref 65–99)
GLUCOSE: 195 mg/dL — AB (ref 65–99)
Potassium: 3.3 mmol/L — ABNORMAL LOW (ref 3.5–5.1)
Sodium: 138 mmol/L (ref 135–145)

## 2017-08-28 LAB — RETICULOCYTES
RBC.: 3.55 MIL/uL — ABNORMAL LOW (ref 3.87–5.11)
Retic Count, Absolute: 46.2 10*3/uL (ref 19.0–186.0)
Retic Ct Pct: 1.3 % (ref 0.4–3.1)

## 2017-08-28 LAB — COMPREHENSIVE METABOLIC PANEL
ALK PHOS: 76 U/L (ref 38–126)
ALT: 32 U/L (ref 14–54)
ANION GAP: 11 (ref 5–15)
AST: 43 U/L — ABNORMAL HIGH (ref 15–41)
Albumin: 2.5 g/dL — ABNORMAL LOW (ref 3.5–5.0)
BILIRUBIN TOTAL: 0.5 mg/dL (ref 0.3–1.2)
BUN: 21 mg/dL — AB (ref 6–20)
CALCIUM: 8.9 mg/dL (ref 8.9–10.3)
CO2: 24 mmol/L (ref 22–32)
Chloride: 104 mmol/L (ref 101–111)
Creatinine, Ser: 0.81 mg/dL (ref 0.44–1.00)
GFR calc Af Amer: >60 mL/min (ref >60)
GLUCOSE: 99 mg/dL (ref 65–99)
POTASSIUM: 2.8 mmol/L — AB (ref 3.5–5.1)
Sodium: 139 mmol/L (ref 135–145)
TOTAL PROTEIN: 6.4 g/dL — AB (ref 6.5–8.1)

## 2017-08-28 LAB — CBC
HEMATOCRIT: 32.7 % — AB (ref 36.0–46.0)
HEMOGLOBIN: 10.7 g/dL — AB (ref 12.0–15.0)
MCH: 30.1 pg (ref 26.0–34.0)
MCHC: 32.7 g/dL (ref 30.0–36.0)
MCV: 92.1 fL (ref 78.0–100.0)
Platelets: 174 10*3/uL (ref 150–400)
RBC: 3.55 MIL/uL — ABNORMAL LOW (ref 3.87–5.11)
RDW: 13.6 % (ref 11.5–15.5)
WBC: 9.3 10*3/uL (ref 4.0–10.5)

## 2017-08-28 LAB — VITAMIN B12: VITAMIN B 12: 599 pg/mL (ref 180–914)

## 2017-08-28 LAB — GLUCOSE, CAPILLARY
GLUCOSE-CAPILLARY: 170 mg/dL — AB (ref 65–99)
Glucose-Capillary: 129 mg/dL — ABNORMAL HIGH (ref 65–99)

## 2017-08-28 LAB — FERRITIN: Ferritin: 480 ng/mL — ABNORMAL HIGH (ref 11–307)

## 2017-08-28 LAB — IRON AND TIBC
IRON: 25 ug/dL — AB (ref 28–170)
SATURATION RATIOS: 15 % (ref 10.4–31.8)
TIBC: 171 ug/dL — AB (ref 250–450)
UIBC: 146 ug/dL

## 2017-08-28 LAB — TSH: TSH: 0.799 u[IU]/mL (ref 0.350–4.500)

## 2017-08-28 LAB — RPR: RPR Ser Ql: NONREACTIVE

## 2017-08-28 LAB — MAGNESIUM
Magnesium: 1.5 mg/dL — ABNORMAL LOW (ref 1.7–2.4)
Magnesium: 1.8 mg/dL (ref 1.7–2.4)

## 2017-08-28 LAB — HEPARIN LEVEL (UNFRACTIONATED)

## 2017-08-28 LAB — FOLATE: FOLATE: 23.3 ng/mL (ref >5.9)

## 2017-08-28 LAB — STREP PNEUMONIAE URINARY ANTIGEN: STREP PNEUMO URINARY ANTIGEN: NEGATIVE

## 2017-08-28 MED ORDER — DILTIAZEM HCL 100 MG IV SOLR
5.0000 mg/h | INTRAVENOUS | Status: DC
  Administered 2017-08-28 – 2017-08-30 (×5): 5 mg/h via INTRAVENOUS
  Filled 2017-08-28 (×6): qty 100

## 2017-08-28 MED ORDER — METOPROLOL TARTRATE 5 MG/5ML IV SOLN
INTRAVENOUS | Status: AC
  Administered 2017-08-28: 5 mg
  Filled 2017-08-28: qty 5

## 2017-08-28 MED ORDER — POTASSIUM CHLORIDE 10 MEQ/50ML IV SOLN
10.0000 meq | INTRAVENOUS | Status: DC
  Filled 2017-08-28 (×4): qty 50

## 2017-08-28 MED ORDER — HEPARIN (PORCINE) IN NACL 100-0.45 UNIT/ML-% IJ SOLN
1300.0000 [IU]/h | INTRAMUSCULAR | Status: DC
  Administered 2017-08-28: 850 [IU]/h via INTRAVENOUS
  Administered 2017-08-29: 1150 [IU]/h via INTRAVENOUS
  Administered 2017-08-30: 1300 [IU]/h via INTRAVENOUS
  Filled 2017-08-28 (×5): qty 250

## 2017-08-28 MED ORDER — ALPRAZOLAM 0.25 MG PO TABS
0.1250 mg | ORAL_TABLET | Freq: Two times a day (BID) | ORAL | Status: DC
  Administered 2017-08-29 – 2017-09-03 (×12): 0.125 mg via ORAL
  Filled 2017-08-28 (×12): qty 1

## 2017-08-28 MED ORDER — FUROSEMIDE 10 MG/ML IJ SOLN
20.0000 mg | Freq: Once | INTRAMUSCULAR | Status: AC
  Administered 2017-08-28: 20 mg via INTRAVENOUS
  Filled 2017-08-28: qty 4

## 2017-08-28 MED ORDER — METOPROLOL TARTRATE 5 MG/5ML IV SOLN
5.0000 mg | Freq: Once | INTRAVENOUS | Status: AC
  Administered 2017-08-28: 5 mg via INTRAVENOUS

## 2017-08-28 MED ORDER — DILTIAZEM LOAD VIA INFUSION
15.0000 mg | Freq: Once | INTRAVENOUS | Status: AC
Start: 2017-08-28 — End: 2017-08-28
  Administered 2017-08-28: 15 mg via INTRAVENOUS
  Filled 2017-08-28: qty 15

## 2017-08-28 MED ORDER — ALPRAZOLAM 0.25 MG PO TABS
0.2500 mg | ORAL_TABLET | Freq: Every day | ORAL | Status: DC
  Administered 2017-08-28 – 2017-09-02 (×6): 0.25 mg via ORAL
  Filled 2017-08-28 (×6): qty 1

## 2017-08-28 MED ORDER — POTASSIUM CHLORIDE 10 MEQ/100ML IV SOLN
10.0000 meq | INTRAVENOUS | Status: AC
  Administered 2017-08-28 (×4): 10 meq via INTRAVENOUS
  Filled 2017-08-28 (×4): qty 100

## 2017-08-28 MED ORDER — MAGNESIUM SULFATE 2 GM/50ML IV SOLN
2.0000 g | Freq: Once | INTRAVENOUS | Status: AC
  Administered 2017-08-28: 2 g via INTRAVENOUS
  Filled 2017-08-28 (×2): qty 50

## 2017-08-28 MED ORDER — ALPRAZOLAM 0.25 MG PO TABS: 0.1250 mg | ORAL_TABLET | ORAL | Status: DC

## 2017-08-28 MED ORDER — HEPARIN BOLUS VIA INFUSION
3000.0000 [IU] | Freq: Once | INTRAVENOUS | Status: AC
  Administered 2017-08-28: 3000 [IU] via INTRAVENOUS
  Filled 2017-08-28: qty 3000

## 2017-08-28 MED ORDER — ROPINIROLE HCL 1 MG PO TABS
1.0000 mg | ORAL_TABLET | Freq: Every day | ORAL | Status: DC
  Administered 2017-08-28 – 2017-09-02 (×6): 1 mg via ORAL
  Filled 2017-08-28 (×6): qty 1

## 2017-08-28 MED ORDER — HEPARIN BOLUS VIA INFUSION
2000.0000 [IU] | Freq: Once | INTRAVENOUS | Status: AC
  Administered 2017-08-28: 2000 [IU] via INTRAVENOUS
  Filled 2017-08-28: qty 2000

## 2017-08-28 NOTE — Progress Notes (Signed)
PT Cancellation Note  Patient Details Name: Patricia Payne MRN: [id number] DOB: 07/19/1923   Cancelled Treatment:    Reason Eval/Treat Not Completed: (P) Patient not medically ready Pt HR above 160 rapid response called and pt transferred to 4E. PT will follow back as able this afternoon.  Alyviah Crandle B. Beverely RisenVan Payne PT, DPT Acute Rehabilitation  [phone] Pager [phone]   Patricia Payne 08/28/2017, 1:19 PM

## 2017-08-28 NOTE — Progress Notes (Signed)
Patient admitted after midnight, please see H&P.  Family reports patient is a DNR.  Patient went into a fib with RVR requiring cardizem gtt and IV heparin-- suspect due to low mg and K. Transition to PO when able.  Treated a possible infection/PNA with IV abx.  Patricia CanaryJessica Nussen Pullin DO

## 2017-08-28 NOTE — Progress Notes (Signed)
OT Cancellation    08/28/17 0924  OT Visit Information  Last OT Received On 08/28/17  Reason Eval/Treat Not Completed Medical issues which prohibited therapy (Per RN, pt HR 184. Will hold until pt medically ready. Will return as schedule allows.)   Jisele Price MSOT, OTR/L Acute Rehab Pager: [phone] Office: [phone]

## 2017-08-28 NOTE — Progress Notes (Signed)
Patient was transported to floor via bed wearing 2liters of oxygen.  Pt transferred over and educated to unit and floor policy.  Explained patient was a high fall risk due to previous falls before admission.  Vital signs stable; patient reports no pain; family oriented to room and policy.  Pt has no questions or needs at this time. Will continue to monitor.

## 2017-08-28 NOTE — Care Management Obs Status (Signed)
MEDICARE OBSERVATION STATUS NOTIFICATION   Patient Details  Name: Patricia Payne MRN: [id number] Date of Birth: 04/11/1923   Medicare Observation Status Notification Given:  Yes    Lawerance Sabalebbie Somara Frymire, RN 08/28/2017, 10:53 AM

## 2017-08-28 NOTE — Evaluation (Addendum)
Clinical/Bedside Swallow Evaluation Patient Details  Name: Patricia Payne MRN: [id number] Date of Birth: 05/25/1923  Today's Date: 08/28/2017 Time: SLP Start Time (ACUTE ONLY): 0825 SLP Stop Time (ACUTE ONLY): 0835 SLP Time Calculation (min) (ACUTE ONLY): 10 min  Past Medical History:  Past Medical History:  Diagnosis Date  . Hypertension    Past Surgical History:  Past Surgical History:  Procedure Laterality Date  . APPENDECTOMY     HPI:  Patricia GillesMildred C Worshamis a 83 y.o.femalewithhistory of hypertension hyperlipidemia who usually lives alone was found with the family to be increasingly confused last few days. Patient was brought [facility] the ER [facility] on August 26, 2017 and workup was negative for any infectious process. Pt did not show improvement and mental status has been progressively declining so patient returned to the ER. Upon admit, pt confused and lethargic; not following commands. CT head was unremarkable; MRI brain ordered. Chest x-ray shows possible pulmonary edema versus infectious process. Pt failed swallow screen.    Assessment / Plan / Recommendation Clinical Impression   Pt presents with no overt s/s of aspiration with any consistencies assessed including solids, thin liquids, and purees. Her cough is strong and voice is clear before and after POs.   Pt's oral motor function is grossly intact and she has effective containment, manipulation, and clearance of boluses from the oral cavity with increased time. Her dentures are new and did not appear to fit properly at the time of today's evaluation.  Recommended that pt's niece bring a denture adhesive to maximize safety with solids.   Cognition was not limiting to swallowing safety at the time of today's evaluation; however, it could be a complicating factor as pt's mentation waxes and wanes throughout the day.  Therefore, recommend that pt resume a regular diet and thin liquids with full supervision for the  use of the following swallow precautions: minimize distractions, slow rate, small bites/sips.  SLP will follow up for toleration of diet while hospitalized.   SLP Visit Diagnosis: Cognitive communication deficit (R41.841)    Aspiration Risk  Mild aspiration risk    Diet Recommendation Regular;Thin liquid   Liquid Administration via: Cup Medication Administration: Whole meds with puree Supervision: Patient able to self feed;Full supervision/cueing for compensatory strategies Compensations: Minimize environmental distractions;Slow rate;Small sips/bites Postural Changes: Seated upright at 90 degrees    Other  Recommendations Oral Care Recommendations: Oral care BID   Follow up Recommendations Other (comment)(TBD)      Frequency and Duration min 2x/week          Prognosis Prognosis for Safe Diet Advancement: Good Barriers to Reach Goals: Cognitive deficits      Swallow Study   General Date of Onset: (see HPI) HPI: Patricia GillesMildred C Worshamis a 83 y.o.femalewithhistory of hypertension hyperlipidemia who usually lives alone was found with the family to be increasingly confused last few days. Patient was [facility] on August 26, 2017 and workup was negative for any infectious process. Pt did not show improvement and mental status has been progressively declining so patient returned to the ER. Upon admit, pt confused and lethargic; not following commands. CT head was unremarkable; MRI brain ordered. Chest x-ray shows possible pulmonary edema versus infectious process. Pt failed swallow screen.  Type of Study: Bedside Swallow Evaluation Previous Swallow Assessment: none on record Diet Prior to this Study: NPO Temperature Spikes Noted: No Respiratory Status: Nasal cannula History of Recent Intubation: No Behavior/Cognition: Alert;Cooperative;Pleasant mood;Confused Oral  Cavity Assessment: Dry Oral Cavity - Dentition: Dentures, top;Dentures, bottom Vision:  Functional for self-feeding Self-Feeding Abilities: Able to feed self Patient Positioning: Upright in bed Baseline Vocal Quality: Normal Volitional Cough: Strong Volitional Swallow: Able to elicit    Oral/Motor/Sensory Function Overall Oral Motor/Sensory Function: Within functional limits   Ice Chips     Thin Liquid Thin Liquid: Within functional limits Presentation: Cup    Nectar Thick     Honey Thick     Puree Puree: Within functional limits   Solid   GO   Solid: Within functional limits    Functional Assessment Tool Used: bedside swallow evaluation Functional Limitations: Swallowing Swallow Current Status (N8295(G8996): At least 1 percent but less than 20 percent impaired, limited or restricted Swallow Goal Status (A2130(G8997): 0 percent impaired, limited or restricted    Jackalyn LombardPage, Jenisha Faison L 08/28/2017,9:21 AM

## 2017-08-28 NOTE — Progress Notes (Signed)
Pt arrived to 4e from 3w. Pt oriented to room and staff. Telemetry box placed and CCMD notified. Vitals obtained. CHG bath completed. Pt denies pain. Heparin drip, cardizem drip, and potassium currently infusing. Will continue current plan of care.  Berdine DanceLauren Moffitt BSN, RN

## 2017-08-28 NOTE — H&P (Addendum)
History and Physical    Patricia BreachMildred C Payne ZOX:[id number] DOB: 02/11/1923 DOA: 08/27/2017  PCP: Patient, No Pcp Per  Patient coming from: Home.  Chief Complaint: Altered mental status.  She obtained from ER physician and charts.  Patient is confused.  Family not at bedside.  HPI: Patricia Payne is a 83 y.o. female with history of hypertension hyperlipidemia who usually lives alone was found with the family to be increasingly confused last few days.  Patient was [facility] August 26, 2017 and workup was negative for any infectious process and plan was to have the case coordinator and physical therapy arranged.  Since patient did not improve and has been progressively declining with regarding to her altered mental status and weakness patient not able to ambulate patient was brought to the ER again.  ED Course: The patient looked confused and lethargic and was not following commands.  CT head was unremarkable.  Chest x-ray shows possible pulmonary edema versus infectious process.  Lab work showed mild leukocytosis patient was afebrile.  UA was unremarkable 2 days ago.  Patient was started on empiric antibiotics for possible pneumonia.  Patient failed swallow evaluation.  On exam patient is only oriented to her name.   Review of Systems: As per HPI, rest all negative.   Past Medical History:  Diagnosis Date  . Hypertension     Past Surgical History:  Procedure Laterality Date  . APPENDECTOMY       reports that  has never smoked. she has never used smokeless tobacco. She reports that she does not drink alcohol or use drugs.  No Known Allergies  Family History  Problem Relation Age of Onset  . Hypertension Other     Prior to Admission medications   Medication Sig Start Date End Date Taking? Authorizing Provider  acetaminophen (TYLENOL) 500 MG tablet Take 1,000 mg by mouth every 6 (six) hours as needed for mild pain.   Yes [provider]  ALPRAZolam (XANAX) 0.25 MG tablet Take 0.125-0.25 mg by mouth See admin instructions. 0.125mg  in morning and afternoon, then 0.25mg  at bedtime 08/21/17  Yes [provider]  losartan (COZAAR) 25 MG tablet Take 25 mg by mouth daily. 05/09/17  Yes [provider]  rOPINIRole (REQUIP) 1 MG tablet Take 1 mg by mouth at bedtime. 05/19/17  Yes [provider]  simvastatin (ZOCOR) 20 MG tablet Take 20 mg by mouth daily. 04/14/17  Yes [provider]  traMADol (ULTRAM) 50 MG tablet Take 50 mg by mouth 2 (two) times daily. 08/21/17  Yes [provider]  valsartan (DIOVAN) 320 MG tablet Take 320 mg by mouth daily. 03/15/17  Yes [provider]  cephALEXin (KEFLEX) 500 MG capsule Take 1 capsule (500 mg total) by mouth 3 (three) times daily. Patient not taking: Reported on 08/26/2017 05/29/17   Renne CriglerGeiple, Joshua, PA-C    Physical Exam: Vitals:   08/27/17 2230 08/28/17 0000 08/28/17 0036 08/28/17 0230  BP: (!) 109/41 (!) 115/58 (!) 153/75 135/68  Pulse: (!) 32 61 75 68  Resp: (!) 31 (!) 29 18 18   Temp:   98.2 F (36.8 C) 98.2 F (36.8 C)  TempSrc:   Oral Oral  SpO2: 97% 97% 97% 100%  Weight:      Height:          Constitutional: Moderately built and nourished. Vitals:   08/27/17 2230 08/28/17 0000 08/28/17 0036 08/28/17 0230  BP: (!) 109/41 Marland Kitchen(!)  115/58 (!) 153/75 135/68  Pulse: (!) 32 61 75 68  Resp: (!) 31 (!) 29 18 18   Temp:   98.2 F (36.8 C) 98.2 F (36.8 C)  TempSrc:   Oral Oral  SpO2: 97% 97% 97% 100%  Weight:      Height:       Eyes: Anicteric no pallor. ENMT: No discharge from the ears eyes nose or mouth. Neck: No mass felt.  No neck rigidity.  No JVD appreciated. Respiratory: No rhonchi or crepitations. Cardiovascular: S1-S2 heard no murmurs appreciated. Abdomen: Soft nontender bowel sounds present.  No guarding or rigidity. Musculoskeletal: Mild edema of the both lower extremities. Skin: Chronic skin changes. Neurologic:  Mildly lethargic oriented to name.  Does not follow commands.  Has difficulty moving lower extremities.  Moves both upper extremity 5 x 5. Psychiatric: Appears confused.   Labs on Admission: I have personally reviewed following labs and imaging studies  CBC: Recent Labs  Lab 08/26/17 0501 08/27/17 1800 08/27/17 1822 08/27/17 2225  WBC 12.0* 11.1*  --  9.7  NEUTROABS  --  8.3*  --   --   HGB 10.7* 11.6* 11.9* 9.9*  HCT 33.4* 34.8* 35.0* 29.8*  MCV 93.6 91.6  --  91.4  PLT 182 205  --  180   Basic Metabolic Panel: Recent Labs  Lab 08/26/17 0501 08/27/17 1800 08/27/17 1822 08/27/17 2225  NA 141 136 139  --   K 3.1* 3.9 3.9  --   CL 105 102 103  --   CO2 26 24  --   --   GLUCOSE 141* 126* 130*  --   BUN 20 17 23*  --   CREATININE 0.73 0.72 0.60 0.82  CALCIUM 9.4 8.8*  --   --    GFR: Estimated Creatinine Clearance: 37.8 mL/min (by C-G formula based on SCr of 0.82 mg/dL). Liver Function Tests: Recent Labs  Lab 08/26/17 0501 08/27/17 1800  AST 40 49*  ALT 28 23  ALKPHOS 90 80  BILITOT 0.8 2.2*  PROT 7.7 6.1*  ALBUMIN 3.9 2.6*   No results for input(s): LIPASE, AMYLASE in the last 168 hours. Recent Labs  Lab 08/27/17 2149  AMMONIA 24   Coagulation Profile: Recent Labs  Lab 08/27/17 1800  INR 1.19   Cardiac Enzymes: No results for input(s): CKTOTAL, CKMB, CKMBINDEX, TROPONINI in the last 168 hours. BNP (last 3 results) No results for input(s): PROBNP in the last 8760 hours. HbA1C: No results for input(s): HGBA1C in the last 72 hours. CBG: Recent Labs  Lab 08/28/17 0140  GLUCAP 170*   Lipid Profile: No results for input(s): CHOL, HDL, LDLCALC, TRIG, CHOLHDL, LDLDIRECT in the last 72 hours. Thyroid Function Tests: No results for input(s): TSH, T4TOTAL, FREET4, T3FREE, THYROIDAB in the last 72 hours. Anemia Panel: No results for input(s): VITAMINB12, FOLATE, FERRITIN, TIBC, IRON, RETICCTPCT in the last 72 hours. Urine analysis:    Component Value  Date/Time   COLORURINE YELLOW 08/27/2017 2225   APPEARANCEUR HAZY (A) 08/27/2017 2225   LABSPEC 1.020 08/27/2017 2225   PHURINE 6.0 08/27/2017 2225   GLUCOSEU NEGATIVE 08/27/2017 2225   HGBUR MODERATE (A) 08/27/2017 2225   BILIRUBINUR NEGATIVE 08/27/2017 2225   KETONESUR 5 (A) 08/27/2017 2225   PROTEINUR >=300 (A) 08/27/2017 2225   NITRITE NEGATIVE 08/27/2017 2225   LEUKOCYTESUR NEGATIVE 08/27/2017 2225   Sepsis Labs: @LABRCNTIP (procalcitonin:4,lacticidven:4) )No results found for this or any previous visit (from the past 240 hour(s)).   Radiological Exams on  Admission: Dg Chest 2 View  Result Date: 08/26/2017 CLINICAL DATA:  Weakness. EXAM: CHEST  2 VIEW COMPARISON:  08/04/2015 FINDINGS: Shallow inspiration with linear atelectasis in the lung bases. Cardiac enlargement with diffuse pulmonary vascular congestion. Perihilar hazy infiltrates consistent with edema. No focal consolidation. No blunting of costophrenic angles. No pneumothorax. Degenerative changes in the spine. Calcification of the aorta. IMPRESSION: Shallow inspiration with atelectasis in the lung bases. Congestive changes with cardiac enlargement, pulmonary vascular congestion, and perihilar edema. Electronically Signed   By: Burman Nieves M.D.   On: 08/26/2017 05:42   Ct Head Wo Contrast  Result Date: 08/27/2017 CLINICAL DATA:  UTI.  Mental status changes. EXAM: CT HEAD WITHOUT CONTRAST TECHNIQUE: Contiguous axial images were obtained from the base of the skull through the vertex without intravenous contrast. COMPARISON:  05/29/2017 FINDINGS: Brain: There is no evidence for acute hemorrhage, hydrocephalus, mass lesion, or abnormal extra-axial fluid collection. No definite CT evidence for acute infarction. Diffuse loss of parenchymal volume is consistent with atrophy. Patchy low attenuation in the deep hemispheric and periventricular white matter is nonspecific, but likely reflects chronic microvascular ischemic  demyelination. Vascular: No hyperdense vessel or unexpected calcification. Skull: No evidence for fracture. No worrisome lytic or sclerotic lesion. Visualized Sinuses/Orbits: The visualized paranasal sinuses and mastoid air cells are clear. Visualized portions of the globes and intraorbital fat are unremarkable. Other: None. IMPRESSION: 1. No acute intracranial abnormality. 2. Atrophy with chronic small vessel white matter ischemic disease. Electronically Signed   By: Kennith Center M.D.   On: 08/27/2017 18:59   Dg Chest Port 1 View  Result Date: 08/27/2017 CLINICAL DATA:  82 y/o  F; hypoxia. EXAM: PORTABLE CHEST 1 VIEW COMPARISON:  08/26/2017 chest radiograph FINDINGS: Normal cardiac silhouette. Aortic atherosclerosis with calcification. These hazy opacification of the lungs and prominent reticular markings. No pleural effusion or pneumothorax. Multilevel degenerative changes of the spine. No acute osseous abnormality identified. IMPRESSION: Diffuse hazy opacification of lungs and reticular markings probably representing pulmonary edema. Underlying pneumonia not excluded. Electronically Signed   By: Mitzi Hansen M.D.   On: 08/27/2017 20:03    EKG: Independently reviewed.  Normal sinus rhythm with LBBB with ST-T changes probably from LBBB.  Assessment/Plan Active Problems:   Acute encephalopathy   HTN (hypertension)    1. Acute encephalopathy -cause not clear.  At this time since chest x-ray shows possible pneumonia patient has been placed on antibiotics for pneumonia.  Will check ammonia levels MRI brain EEG and also get x-ray of the spine.  X-ray of the spine shows any abnormality will need to get MRI of the spine.  In addition we will check RPR B12 folate TSH levels.  Physical therapy consult. 2. Possible pneumonia versus CHF -patient is on empiric antibiotics for now.  Closely observe.  Check urine for Legionella strep antigen and swallow evaluation. 3. Hypertension -since patient  failed swallow we will keep patient on as needed IV hydralazine by mouth.  Until patient passes swallow. 4. History of hyperlipidemia status can be continued if patient passes swallow. 5. Anemia -check anemia panel follow CBC. 6. Mildly elevated troponin -we will cycle cardiac markers check 2D echo.  Could be from possible CHF.   DVT prophylaxis: Lovenox. Code Status: Full code. Family Communication: No family at the bedside. Disposition Plan: To be determined. Consults called: Physical therapy. Admission status: Observation.   Eduard Clos MD [facility] Pager [phone].  If 7PM-7AM, please contact night-coverage [email or web address] Password [facility]  08/28/2017, 3:52  AM

## 2017-08-28 NOTE — Progress Notes (Signed)
ANTICOAGULATION CONSULT NOTE  Pharmacy Consult for Heparin Indication: atrial fibrillation  No Known Allergies  Patient Measurements: Height: 5\' 4"  (162.6 cm) Weight: 135 lb (61.2 kg) IBW/kg (Calculated) : 54.7 Heparin Dosing Weight: 61 kg  Vital Signs: Temp: 97.4 F (36.3 C) (11/27 2005) Temp Source: Oral (11/27 2005) BP: 131/94 (11/27 2005) Pulse Rate: 50 (11/27 2005)  Labs: Recent Labs    08/27/17 1800 08/27/17 1822 08/27/17 2225 08/28/17 0555 08/28/17 1003 08/28/17 1510 08/28/17 1648  HGB 11.6* 11.9* 9.9* 10.7*  --   --   --   HCT 34.8* 35.0* 29.8* 32.7*  --   --   --   PLT 205  --  180 174  --   --   --   APTT 27  --   --   --   --   --   --   LABPROT 15.0  --   --   --   --   --   --   INR 1.19  --   --   --   --   --   --   HEPARINUNFRC  --   --   --   --   --   --  <0.10*  CREATININE 0.72 0.60 0.82 0.81  --  0.93 0.92  TROPONINI  --   --   --  0.06* 0.07* 0.42*  --     Estimated Creatinine Clearance: 33.7 mL/min (by C-G formula based on SCr of 0.92 mg/dL).   Medical History: Past Medical History:  Diagnosis Date  . Hypertension     Medications:  Medications Prior to Admission  Medication Sig Dispense Refill Last Dose  . acetaminophen (TYLENOL) 500 MG tablet Take 1,000 mg by mouth every 6 (six) hours as needed for mild pain.   prn  . ALPRAZolam (XANAX) 0.25 MG tablet Take 0.125-0.25 mg by mouth See admin instructions. 0.125mg  in morning and afternoon, then 0.25mg  at bedtime  0 08/27/2017 at Unknown time  . losartan (COZAAR) 25 MG tablet Take 25 mg by mouth daily.  0 08/27/2017 at Unknown time  . rOPINIRole (REQUIP) 1 MG tablet Take 1 mg by mouth at bedtime.  0 08/26/2017 at Unknown time  . simvastatin (ZOCOR) 20 MG tablet Take 20 mg by mouth daily.  0 08/27/2017 at Unknown time  . traMADol (ULTRAM) 50 MG tablet Take 50 mg by mouth 2 (two) times daily.  0 08/27/2017 at Unknown time  . valsartan (DIOVAN) 320 MG tablet Take 320 mg by mouth daily.  0  08/27/2017 at Unknown time  . cephALEXin (KEFLEX) 500 MG capsule Take 1 capsule (500 mg total) by mouth 3 (three) times daily. (Patient not taking: Reported on 08/26/2017) 21 capsule 0 Completed Course at Unknown time   Scheduled:  . [START ON 08/29/2017] ALPRAZolam  0.125 mg Oral BID   And  . ALPRAZolam  0.25 mg Oral QHS  . rOPINIRole  1 mg Oral QHS    Assessment: 83 y.o female admitted on 08/27/17 with altered mental status. CT head was unremarkable. MRI brain ordered. Pharmacy consulted to start IV heparin for atrial fibrillation. Not on anticoagulation prior to admit.   -initial heparin level < 0.1  Goal of Therapy:  Heparin level 0.3-0.7 units/ml Monitor platelets by anticoagulation protocol: Yes   Plan:  -heparin 2000 unit bolus and increase to 1050 units/hr -Heparin level in 8 hours and daily wth CBC daily  Harland GermanAndrew Sosie Gato, Pharm D 08/28/2017 8:30 PM

## 2017-08-28 NOTE — Progress Notes (Signed)
Telemetry notified this nurse patient sustaining heart rate 160's, new onset Afib. Rapid response at bedside.   MD made aware, new orders in place.  Patient asymptomatic. Report given to 4E patient transferred to new unit.  Family at bedside and supportive.

## 2017-08-28 NOTE — Significant Event (Signed)
Rapid Response Event Note  Overview: Time Called: 0934 Arrival Time: 0940 Event Type: Cardiac  Initial Focused Assessment: Patient with RAF HR 160s   Patient alert, but confused family at bedside Lung sounds clear, heart tones irregular. 124/58 HR 160s  RR 20  O2 sat 97%  Interventions: Potassium and Magnesium given Lopressor given IV Cardizem gtt and bolus started Bp 124/78  HR 78  RR 20  O2 sat 97%  Transferred to 4E18  Plan of Care (if not transferred):  Event Summary: Name of Physician Notified: Dr Benjamine MolaVann prior to my arrival at      at    Outcome: Transferred (Comment)  Event End Time: 1115  Marcellina MillinLayton, Benedetta Sundstrom

## 2017-08-28 NOTE — Evaluation (Signed)
Speech Language Pathology Evaluation Patient Details Name: Patricia BreachMildred C Payne MRN: [id number] DOB: 11/08/1922 Today's Date: 08/28/2017 Time: [id number] SLP Time Calculation (min) (ACUTE ONLY): 15 min  Problem List:  Patient Active Problem List   Diagnosis Date Noted  . Acute encephalopathy 08/27/2017  . HTN (hypertension) 08/27/2017   Past Medical History:  Past Medical History:  Diagnosis Date  . Hypertension    Past Surgical History:  Past Surgical History:  Procedure Laterality Date  . APPENDECTOMY     HPI:  Patricia GillesMildred C Worshamis a [age] y.o.femalewithhistory of hypertension hyperlipidemia who usually lives alone was found with the family to be increasingly confused last few days. [facility] August 26, 2017 and workup was negative for any infectious process. Pt did not show improvement and mental status has been progressively declining so patient returned to the ER. Upon admit, pt confused and lethargic; not following commands. CT head was unremarkable; MRI brain ordered. Chest x-ray shows possible pulmonary edema versus infectious process. Pt failed swallow screen.    Assessment / Plan / Recommendation Clinical Impression   Pt presents with mild cognitive impairment which her family reports to be altered from her baseline.  Pt had increased response latency, decreased attention to tasks, slowed task initiation, and decreased recall of new information.  Awaiting results of MRI.  Pt was oriented to place and self but needed max assist to orient to general date and situation.  Pt could follow 1 step commands in a functional context without difficulty.  Her problem solving for familiar, automatic tasks seemed grossly WFL.  ST will follow up while hospitalized for cognitive treatment to facilitate return to previous level of function given that pt was living alone prior to admission.      SLP Assessment  SLP Recommendation/Assessment:  Patient needs continued Speech Lanaguage Pathology Services SLP Visit Diagnosis: Cognitive communication deficit (R41.841)    Follow Up Recommendations  Other (comment)(TBD)    Frequency and Duration min 2x/week         SLP Evaluation Cognition  Overall Cognitive Status: Impaired/Different from baseline Arousal/Alertness: Awake/alert Orientation Level: Oriented to person;Oriented to place;Disoriented to time Attention: Sustained Sustained Attention: Impaired Sustained Attention Impairment: Verbal basic Memory: Impaired Memory Impairment: Decreased recall of new information;Retrieval deficit       Comprehension  Auditory Comprehension Overall Auditory Comprehension: Appears within functional limits for tasks assessed    Expression Expression Primary Mode of Expression: Verbal Verbal Expression Overall Verbal Expression: Appears within functional limits for tasks assessed   Oral / Motor  Oral Motor/Sensory Function Overall Oral Motor/Sensory Function: Within functional limits Motor Speech Overall Motor Speech: Appears within functional limits for tasks assessed   GO          Functional Assessment Tool Used: cognitive-linguistic evaluation complete Functional Limitations: Attention Attention Current Status (H0865(G9165): At least 40 percent but less than 60 percent impaired, limited or restricted Attention Goal Status (H8469(G9166): At least 1 percent but less than 20 percent impaired, limited or restricted         Patricia HurterPage, Patricia Payne L 08/28/2017, 9:12 AM

## 2017-08-28 NOTE — Progress Notes (Signed)
EKG completed and in chart.   Amir Glaus, RN 

## 2017-08-28 NOTE — Progress Notes (Signed)
ANTICOAGULATION CONSULT NOTE - Initial Consult  Pharmacy Consult for Heparin Indication: atrial fibrillation  No Known Allergies  Patient Measurements: Height: 5\' 4"  (162.6 cm) Weight: 135 lb (61.2 kg) IBW/kg (Calculated) : 54.7 Heparin Dosing Weight: 61 kg  Vital Signs: Temp: 98.2 F (36.8 C) (11/27 0618) Temp Source: Oral (11/27 0618) BP: 124/46 (11/27 0618) Pulse Rate: 68 (11/27 0618)  Labs: Recent Labs    08/27/17 1800 08/27/17 1822 08/27/17 2225 08/28/17 0555  HGB 11.6* 11.9* 9.9* 10.7*  HCT 34.8* 35.0* 29.8* 32.7*  PLT 205  --  180 174  APTT 27  --   --   --   LABPROT 15.0  --   --   --   INR 1.19  --   --   --   CREATININE 0.72 0.60 0.82 0.81  TROPONINI  --   --   --  0.06*    Estimated Creatinine Clearance: 38.3 mL/min (by C-G formula based on SCr of 0.81 mg/dL).   Medical History: Past Medical History:  Diagnosis Date  . Hypertension     Medications:  Medications Prior to Admission  Medication Sig Dispense Refill Last Dose  . acetaminophen (TYLENOL) 500 MG tablet Take 1,000 mg by mouth every 6 (six) hours as needed for mild pain.   prn  . ALPRAZolam (XANAX) 0.25 MG tablet Take 0.125-0.25 mg by mouth See admin instructions. 0.125mg  in morning and afternoon, then 0.25mg  at bedtime  0 08/27/2017 at Unknown time  . losartan (COZAAR) 25 MG tablet Take 25 mg by mouth daily.  0 08/27/2017 at Unknown time  . rOPINIRole (REQUIP) 1 MG tablet Take 1 mg by mouth at bedtime.  0 08/26/2017 at Unknown time  . simvastatin (ZOCOR) 20 MG tablet Take 20 mg by mouth daily.  0 08/27/2017 at Unknown time  . traMADol (ULTRAM) 50 MG tablet Take 50 mg by mouth 2 (two) times daily.  0 08/27/2017 at Unknown time  . valsartan (DIOVAN) 320 MG tablet Take 320 mg by mouth daily.  0 08/27/2017 at Unknown time  . cephALEXin (KEFLEX) 500 MG capsule Take 1 capsule (500 mg total) by mouth 3 (three) times daily. (Patient not taking: Reported on 08/26/2017) 21 capsule 0 Completed Course at  Unknown time   Scheduled:  . diltiazem  15 mg Intravenous Once  . furosemide  20 mg Intravenous Once    Assessment: 83 y.o female admitted on 08/27/17 with altered mental status. CT head was unremarkable. MRI brain ordered   Today, pharmacy consulted to start IV heparin for atrial fibrillation. Not on anticoagulation prior to admit.   H/H low/stable and pltc within normal limits. No bleeding noted  Goal of Therapy:  Heparin level 0.3-0.7 units/ml Monitor platelets by anticoagulation protocol: Yes   Plan:  Heparin 3000 units IV x1 Heparin drip at rate of 850 units/hr 8 hour heparin level Daily heparin level and CBC.   Thank you for allowing pharmacy to be part of this patients care team. Noah Delaineuth Scott Fix, RPh Clinical Pharmacist Pager: [phone] [facility] [phone] 08/28/2017,8:58 AM

## 2017-08-29 ENCOUNTER — Other Ambulatory Visit (HOSPITAL_COMMUNITY): Payer: Medicare Other

## 2017-08-29 ENCOUNTER — Observation Stay (HOSPITAL_COMMUNITY): Payer: Medicare Other

## 2017-08-29 ENCOUNTER — Observation Stay (HOSPITAL_BASED_OUTPATIENT_CLINIC_OR_DEPARTMENT_OTHER): Payer: Medicare Other

## 2017-08-29 DIAGNOSIS — Z79891 Long term (current) use of opiate analgesic: Secondary | ICD-10-CM | POA: Diagnosis not present

## 2017-08-29 DIAGNOSIS — J189 Pneumonia, unspecified organism: Secondary | ICD-10-CM | POA: Diagnosis present

## 2017-08-29 DIAGNOSIS — R0902 Hypoxemia: Secondary | ICD-10-CM

## 2017-08-29 DIAGNOSIS — I4891 Unspecified atrial fibrillation: Secondary | ICD-10-CM | POA: Diagnosis not present

## 2017-08-29 DIAGNOSIS — I10 Essential (primary) hypertension: Secondary | ICD-10-CM | POA: Diagnosis present

## 2017-08-29 DIAGNOSIS — F419 Anxiety disorder, unspecified: Secondary | ICD-10-CM | POA: Diagnosis present

## 2017-08-29 DIAGNOSIS — I6523 Occlusion and stenosis of bilateral carotid arteries: Secondary | ICD-10-CM | POA: Diagnosis present

## 2017-08-29 DIAGNOSIS — I361 Nonrheumatic tricuspid (valve) insufficiency: Secondary | ICD-10-CM

## 2017-08-29 DIAGNOSIS — D649 Anemia, unspecified: Secondary | ICD-10-CM | POA: Diagnosis present

## 2017-08-29 DIAGNOSIS — G9340 Encephalopathy, unspecified: Secondary | ICD-10-CM | POA: Diagnosis not present

## 2017-08-29 DIAGNOSIS — A419 Sepsis, unspecified organism: Secondary | ICD-10-CM | POA: Diagnosis present

## 2017-08-29 DIAGNOSIS — R4182 Altered mental status, unspecified: Secondary | ICD-10-CM | POA: Diagnosis not present

## 2017-08-29 DIAGNOSIS — E785 Hyperlipidemia, unspecified: Secondary | ICD-10-CM | POA: Diagnosis present

## 2017-08-29 DIAGNOSIS — W19XXXA Unspecified fall, initial encounter: Secondary | ICD-10-CM | POA: Diagnosis not present

## 2017-08-29 DIAGNOSIS — Z66 Do not resuscitate: Secondary | ICD-10-CM | POA: Diagnosis present

## 2017-08-29 DIAGNOSIS — E876 Hypokalemia: Secondary | ICD-10-CM | POA: Diagnosis present

## 2017-08-29 DIAGNOSIS — G2581 Restless legs syndrome: Secondary | ICD-10-CM | POA: Diagnosis present

## 2017-08-29 DIAGNOSIS — Z8249 Family history of ischemic heart disease and other diseases of the circulatory system: Secondary | ICD-10-CM | POA: Diagnosis not present

## 2017-08-29 DIAGNOSIS — I447 Left bundle-branch block, unspecified: Secondary | ICD-10-CM | POA: Diagnosis present

## 2017-08-29 DIAGNOSIS — G9341 Metabolic encephalopathy: Secondary | ICD-10-CM | POA: Diagnosis present

## 2017-08-29 DIAGNOSIS — K5909 Other constipation: Secondary | ICD-10-CM | POA: Diagnosis present

## 2017-08-29 DIAGNOSIS — I480 Paroxysmal atrial fibrillation: Secondary | ICD-10-CM | POA: Diagnosis present

## 2017-08-29 DIAGNOSIS — R627 Adult failure to thrive: Secondary | ICD-10-CM | POA: Diagnosis present

## 2017-08-29 LAB — BLOOD GAS, ARTERIAL
ACID-BASE EXCESS: 1.4 mmol/L (ref 0.0–2.0)
BICARBONATE: 24.7 mmol/L (ref 20.0–28.0)
Drawn by: 24486
O2 CONTENT: 2 L/min
O2 Saturation: 97.4 %
PATIENT TEMPERATURE: 98.6
PO2 ART: 93.8 mmHg (ref 83.0–108.0)
pCO2 arterial: 34.1 mmHg (ref 32.0–48.0)
pH, Arterial: 7.474 — ABNORMAL HIGH (ref 7.350–7.450)

## 2017-08-29 LAB — ECHOCARDIOGRAM COMPLETE
HEIGHTINCHES: 64 in
WEIGHTICAEL: 2190.4 [oz_av]

## 2017-08-29 LAB — BASIC METABOLIC PANEL
ANION GAP: 8 (ref 5–15)
BUN: 23 mg/dL — ABNORMAL HIGH (ref 6–20)
CO2: 24 mmol/L (ref 22–32)
Calcium: 8.5 mg/dL — ABNORMAL LOW (ref 8.9–10.3)
Chloride: 106 mmol/L (ref 101–111)
Creatinine, Ser: 0.74 mg/dL (ref 0.44–1.00)
GFR calc Af Amer: >60 mL/min (ref >60)
Glucose, Bld: 135 mg/dL — ABNORMAL HIGH (ref 65–99)
POTASSIUM: 2.8 mmol/L — AB (ref 3.5–5.1)
SODIUM: 138 mmol/L (ref 135–145)

## 2017-08-29 LAB — HEPARIN LEVEL (UNFRACTIONATED)
Heparin Unfractionated: 0.23 IU/mL — ABNORMAL LOW (ref 0.30–0.70)
Heparin Unfractionated: 0.18 IU/mL — ABNORMAL LOW (ref 0.30–0.70)
Heparin Unfractionated: 0.43 IU/mL (ref 0.30–0.70)

## 2017-08-29 LAB — PROCALCITONIN: Procalcitonin: 0.86 ng/mL

## 2017-08-29 LAB — CBC
HCT: 30.1 % — ABNORMAL LOW (ref 36.0–46.0)
Hemoglobin: 9.8 g/dL — ABNORMAL LOW (ref 12.0–15.0)
MCH: 29.5 pg (ref 26.0–34.0)
MCHC: 32.6 g/dL (ref 30.0–36.0)
MCV: 90.7 fL (ref 78.0–100.0)
PLATELETS: 193 10*3/uL (ref 150–400)
RBC: 3.32 MIL/uL — AB (ref 3.87–5.11)
RDW: 13.7 % (ref 11.5–15.5)
WBC: 8.7 10*3/uL (ref 4.0–10.5)

## 2017-08-29 LAB — GLUCOSE, CAPILLARY: Glucose-Capillary: 111 mg/dL — ABNORMAL HIGH (ref 65–99)

## 2017-08-29 LAB — TROPONIN I: TROPONIN I: 0.17 ng/mL — AB (ref <0.03)

## 2017-08-29 LAB — LEGIONELLA PNEUMOPHILA SEROGP 1 UR AG: L. PNEUMOPHILA SEROGP 1 UR AG: NEGATIVE

## 2017-08-29 LAB — URINE CULTURE: Culture: NO GROWTH

## 2017-08-29 MED ORDER — HEPARIN BOLUS VIA INFUSION
2000.0000 [IU] | Freq: Once | INTRAVENOUS | Status: AC
  Administered 2017-08-29: 2000 [IU] via INTRAVENOUS
  Filled 2017-08-29: qty 2000

## 2017-08-29 MED ORDER — FUROSEMIDE 10 MG/ML IJ SOLN
40.0000 mg | Freq: Once | INTRAMUSCULAR | Status: AC
  Administered 2017-08-29: 40 mg via INTRAVENOUS
  Filled 2017-08-29: qty 4

## 2017-08-29 MED ORDER — POTASSIUM CHLORIDE 10 MEQ/100ML IV SOLN
10.0000 meq | INTRAVENOUS | Status: AC
  Administered 2017-08-29 (×4): 10 meq via INTRAVENOUS
  Filled 2017-08-29 (×4): qty 100

## 2017-08-29 MED ORDER — SENNOSIDES-DOCUSATE SODIUM 8.6-50 MG PO TABS
1.0000 | ORAL_TABLET | Freq: Two times a day (BID) | ORAL | Status: DC
  Administered 2017-08-29 – 2017-09-03 (×8): 1 via ORAL
  Filled 2017-08-29 (×9): qty 1

## 2017-08-29 NOTE — Evaluation (Signed)
Occupational Therapy Evaluation Patient Details Name: Patricia Payne MRN: [id number] DOB: 1923-06-22 Today's Date: 08/29/2017    History of Present Illness Pt is a 83 y.o. female who presented with altered mental status and weakness. Had been seen in ED day before admission for potential UTI but work-up was negative and pt was sent home where she became increasingly lethargic, confused, and having slurred speech. PMH significant for hypertension. CT negative and MRI pending.    Clinical Impression   PTA, pt demonstrated recent significant decline in functional independence and pt's family had been unable to get pt to bathroom the few weeks before admission. Pt currently requires min assist for bed level grooming and feeding tasks and otherwise total assistance for ADL. She was pleasantly confused throughout this session. Pt is limited by B UE weakness, tremor, and decreased activity tolerance for ADL impacting her independence and safety with ADL. Feel pt will need SNF level rehabilitation post-acute D/C and pt/family in agreement. OT will continue to follow while admitted.     Follow Up Recommendations  SNF;Supervision/Assistance - 24 hour    Equipment Recommendations  Other (comment)(defer to next venue of care)    Recommendations for Other Services       Precautions / Restrictions Precautions Precautions: Fall Restrictions Weight Bearing Restrictions: No      Mobility Bed Mobility               General bed mobility comments: Not tested as pt just finished PT session. See PT note for details of mobility.   Transfers                      Balance                                           ADL either performed or assessed with clinical judgement   ADL Overall ADL's : Needs assistance/impaired Eating/Feeding: Minimal assistance;Bed level Eating/Feeding Details (indicate cue type and reason): Tremor and weakness impacting ability to complete.  Required two hands to utilize fork and bring food to her mouth. Grooming: Minimal assistance;Bed level   Upper Body Bathing: Total assistance;Bed level   Lower Body Bathing: Total assistance;Bed level   Upper Body Dressing : Total assistance;Bed level   Lower Body Dressing: Total assistance;Bed level       Toileting- Clothing Manipulation and Hygiene: Total assistance;Sit to/from stand         General ADL Comments: Pt limited by weakness, tremor, and cognition.      Vision Baseline Vision/History: No visual deficits Patient Visual Report: No change from baseline Vision Assessment?: No apparent visual deficits     Perception     Praxis      Pertinent Vitals/Pain Pain Assessment: No/denies pain     Hand Dominance     Extremity/Trunk Assessment Upper Extremity Assessment Upper Extremity Assessment: Generalized weakness(tremor bilaterally)   Lower Extremity Assessment Lower Extremity Assessment: Defer to PT evaluation       Communication Communication Communication: No difficulties   Cognition Arousal/Alertness: Awake/alert Behavior During Therapy: WFL for tasks assessed/performed Overall Cognitive Status: Impaired/Different from baseline Area of Impairment: Attention;Memory;Following commands;Safety/judgement;Awareness;Problem solving                   Current Attention Level: Sustained Memory: Decreased short-term memory Following Commands: Follows one step commands consistently;Follows one step commands with increased time  Safety/Judgement: Decreased awareness of safety;Decreased awareness of deficits Awareness: Intellectual Problem Solving: Slow processing;Decreased initiation General Comments: Pt pleasant but confused during session.    General Comments  Pt's sister present during session but falling asleep at times. Reports exhaustion from the past few weeks and that she and her husband have been unable to even get pt to bathroom.      Exercises     Shoulder Instructions      Home Living Family/patient expects to be discharged to:: Private residence Living Arrangements: Alone Available Help at Discharge: Family;Available 24 hours/day Type of Home: House             Bathroom Shower/Tub: Tub/shower unit(does not use shower)   Bathroom Toilet: Standard(with riser)     Home Equipment: Environmental consultantWalker - 2 wheels;Cane - single point;Toilet riser   Additional Comments: Pt lives alone but family has had to stay with her off and on over the past 3 months and continuously over the past 3 weeks.       Prior Functioning/Environment Level of Independence: Needs assistance  Gait / Transfers Assistance Needed: Had been using a cane or walker but over the past 3 weeks significant decline in mobility and family has been unable to get pt up to the bathroom.  ADL's / Homemaking Assistance Needed: Requires assistance for meal prep. Until the past 3 weeks, was completing majority of ADL on her own but now needing total assistance.             OT Problem List: Decreased strength;Decreased activity tolerance;Impaired balance (sitting and/or standing);Decreased cognition;Decreased safety awareness;Decreased knowledge of use of DME or AE;Decreased knowledge of precautions;Impaired UE functional use      OT Treatment/Interventions: Self-care/ADL training;Therapeutic exercise;Energy conservation;DME and/or AE instruction;Therapeutic activities;Patient/family education;Balance training    OT Goals(Current goals can be found in the care plan section) Acute Rehab OT Goals Patient Stated Goal: eat my lunch OT Goal Formulation: With patient/family Time For Goal Achievement: 09/12/17 Potential to Achieve Goals: Good ADL Goals Pt Will Perform Eating: with modified independence;sitting Pt Will Perform Grooming: with modified independence;sitting Pt Will Transfer to Toilet: with max assist;stand pivot transfer;bedside commode Pt Will Perform  Toileting - Clothing Manipulation and hygiene: with max assist;sit to/from stand Additional ADL Goal #1: Pt will complete bed mobility in preparation for ADL participation seated at EOB with overall min assist.  OT Frequency: Min 1X/week   Barriers to D/C:            Co-evaluation              AM-PAC PT "6 Clicks" Daily Activity     Outcome Measure Help from another person eating meals?: A Little Help from another person taking care of personal grooming?: A Little Help from another person toileting, which includes using toliet, bedpan, or urinal?: Total Help from another person bathing (including washing, rinsing, drying)?: Total Help from another person to put on and taking off regular upper body clothing?: Total Help from another person to put on and taking off regular lower body clothing?: Total 6 Click Score: 10   End of Session Equipment Utilized During Treatment: Gait belt  Activity Tolerance:   Patient left:    OT Visit Diagnosis: Muscle weakness (generalized) (M62.81);Other abnormalities of gait and mobility (R26.89);Other symptoms and signs involving cognitive function                Time: 1250-1305 OT Time Calculation (min): 15 min Charges:  OT General Charges $OT Visit: 1  Visit OT Evaluation $OT Eval Moderate Complexity: 1 Mod G-Codes:     Doristine Sectionharity A Gwynneth Fabio, MS OTR/L  Pager: [phone]   Tara Rud A Sharnelle Cappelli 08/29/2017, 2:44 PM

## 2017-08-29 NOTE — Progress Notes (Signed)
PROGRESS NOTE  Ernst BreachMildred C Champagne ZOX:[id number] DOB: 11/09/1922 DOA: 08/27/2017 PCP: Patient, No Pcp Per  HPI/Recap of past 24 hours: [name] with hx of HTN, HLD from home who lives alone presented with altered mental status on evening of 08/27/17. Suspected CAP, present on admission complicated by a-fib RvR and pulmonary edema. Had rapid response called yesterday morning 08/28/17 due to A-fib rvr with rate in the 160's cardizem drip and heparin drip initiated. On IV antibiotics for CAP.  No acute events reported overnight. Pt seen and examined with family members at her bedside. Alert and oriented x 3. States she feels better. Denies chest pain, dyspnea or palpitations. Echo cardiogram done this am. Hypoxic while in the room. Asymptomatic. Increased O2 [place] to 3L. Will get routine ABG and 1 dose IV lasix 40 mg once.  Assessment/Plan: Active Problems:   Acute encephalopathy   HTN (hypertension)   Atrial fibrillation with RVR (HCC)   Hypokalemia   Hypomagnesemia  1. Altered mental status, resolved -most likely 2/2 to sepsis, CAP, present on admission -continue IV ceftriaxone and IV azithromycin, day #2 -continue duonebs prn -continue O2 supplementation to maintain O2 sat 92% or greater    2. Community acquired PNA, present on admission -management as stated above -swallow eval to r/o possible dysphagia -ABG no sign of hypoxemia on 2L [place]  3. Afib with RvR -diltiazem drip as needed -rate controlled -heparin drip  -telemetry  -CHADSVASC score 5  4. Ambulatory dysfunction -PT to evaluate and treat -may need SNF for physical rehab-lives alone  5. Hypertension  -resume losartan if she passes swallow evaluation -prn IV hydralazine for sbp >180 or dbp>105  6. HLD -resume simvastatin if passes swallow evaluation  7. Chronic normocytic anemia -No sign of active bleeding- -hg stable 9.0 -baseline hg 10 -CBC am  8. Elevated troponin -most likely 2/2 to afib  rvr -peaked at 0.4 trending down to 0.17 -No ST T elevation. EKG personally reviewed. -2D echo done today 08/29/17 LVEF 55-60%  9. Anxiety -stable -xanax  10. Restless leg syndrome -ropinirole  11. chronic constipation -sennokot  12. Hypokalemia -K+ 2.8 -replete as indicated -BMP am   Code Status: DNR  Family Communication: Will family members including her sister who is also medical POA and her niece. All questions answered to their satisfaction.  Disposition Plan: Will stay another midnight to continue IV antibiotics.   Consultants:  None   Procedures:  None  Antimicrobials:  IV ceftriaxone and IV azithromycin day # 2  DVT prophylaxis:  heparin drip due to a-fib with rvr   Objective: Vitals:   08/29/17 0017 08/29/17 0454 08/29/17 0500 08/29/17 0734  BP: (!) 141/80 (!) 146/61  (!) 140/59  Pulse: 67 79  61  Resp: (!) 24 (!) 21  18  Temp: 98 F (36.7 C) 97.9 F (36.6 C)  97.9 F (36.6 C)  TempSrc: Axillary Oral  Oral  SpO2: 96% 92%  93%  Weight:   62.1 kg (136 lb 14.4 oz)   Height:        Intake/Output Summary (Last 24 hours) at 08/29/2017 0746 Last data filed at 08/29/2017 0512 Gross per 24 hour  Intake 1279.52 ml  Output 1100 ml  Net 179.52 ml   Filed Weights   08/27/17 1812 08/29/17 0500  Weight: 61.2 kg (135 lb) 62.1 kg (136 lb 14.4 oz)    Exam:   General:  83 yo CF thin built laying in bed in NAD.  A&O x3  Cardiovascular: IRRR with no rubs or gallops   Respiratory: Mild crackles at bases bilaterally. No wheezes noted  Abdomen: soft NT ND NBS x4 quadrants  Musculoskeletal: Moves all limbs. 2/4 pulses in all 4  Skin: No noted open lesions  Psychiatry: Mood is appropriate for condition and setting   Data Reviewed: CBC: Recent Labs  Lab 08/26/17 0501 08/27/17 1800 08/27/17 1822 08/27/17 2225 08/28/17 0555 08/29/17 0341  WBC 12.0* 11.1*  --  9.7 9.3 8.7  NEUTROABS  --  8.3*  --   --   --   --   HGB 10.7* 11.6* 11.9*  9.9* 10.7* 9.8*  HCT 33.4* 34.8* 35.0* 29.8* 32.7* 30.1*  MCV 93.6 91.6  --  91.4 92.1 90.7  PLT 182 205  --  180 174 193   Basic Metabolic Panel: Recent Labs  Lab 08/27/17 1800 08/27/17 1822 08/27/17 2225 08/28/17 0555 08/28/17 1003 08/28/17 1510 08/28/17 1648 08/29/17 0341  NA 136 139  --  139  --  NOT DONE 138 138  K 3.9 3.9  --  2.8*  --  NOT DONE 3.3* 2.8*  CL 102 103  --  104  --  NOT DONE 106 106  CO2 24  --   --  24  --  NOT DONE 22 24  GLUCOSE 126* 130*  --  99  --  134* 195* 135*  BUN 17 23*  --  21*  --  NOT DONE 24* 23*  CREATININE 0.72 0.60 0.82 0.81  --  0.93 0.92 0.74  CALCIUM 8.8*  --   --  8.9  --  NOT DONE 8.4* 8.5*  MG  --   --   --   --  1.5* NOT DONE 1.8  --    GFR: Estimated Creatinine Clearance: 38.7 mL/min (by C-G formula based on SCr of 0.74 mg/dL). Liver Function Tests: Recent Labs  Lab 08/26/17 0501 08/27/17 1800 08/28/17 0555  AST 40 49* 43*  ALT 28 23 32  ALKPHOS 90 80 76  BILITOT 0.8 2.2* 0.5  PROT 7.7 6.1* 6.4*  ALBUMIN 3.9 2.6* 2.5*   No results for input(s): LIPASE, AMYLASE in the last 168 hours. Recent Labs  Lab 08/27/17 2149  AMMONIA 24   Coagulation Profile: Recent Labs  Lab 08/27/17 1800  INR 1.19   Cardiac Enzymes: Recent Labs  Lab 08/28/17 0555 08/28/17 1003 08/28/17 1510  TROPONINI 0.06* 0.07* 0.42*   BNP (last 3 results) No results for input(s): PROBNP in the last 8760 hours. HbA1C: No results for input(s): HGBA1C in the last 72 hours. CBG: Recent Labs  Lab 08/28/17 0140 08/28/17 2012  GLUCAP 170* 129*   Lipid Profile: No results for input(s): CHOL, HDL, LDLCALC, TRIG, CHOLHDL, LDLDIRECT in the last 72 hours. Thyroid Function Tests: Recent Labs    08/28/17 0555  TSH 0.799   Anemia Panel: Recent Labs    08/28/17 0555  VITAMINB12 599  FOLATE 23.3  FERRITIN 480*  TIBC 171*  IRON 25*  RETICCTPCT 1.3   Urine analysis:    Component Value Date/Time   COLORURINE YELLOW 08/27/2017 2225    APPEARANCEUR HAZY (A) 08/27/2017 2225   LABSPEC 1.020 08/27/2017 2225   PHURINE 6.0 08/27/2017 2225   GLUCOSEU NEGATIVE 08/27/2017 2225   HGBUR MODERATE (A) 08/27/2017 2225   BILIRUBINUR NEGATIVE 08/27/2017 2225   KETONESUR 5 (A) 08/27/2017 2225   PROTEINUR >=300 (A) 08/27/2017 2225   NITRITE NEGATIVE 08/27/2017 2225   LEUKOCYTESUR NEGATIVE  08/27/2017 2225   Sepsis Labs: @LABRCNTIP (procalcitonin:4,lacticidven:4)  )No results found for this or any previous visit (from the past 240 hour(s)).    Studies: No results found.  Scheduled Meds: . ALPRAZolam  0.125 mg Oral BID   And  . ALPRAZolam  0.25 mg Oral QHS  . rOPINIRole  1 mg Oral QHS    Continuous Infusions: . azithromycin Stopped (08/29/17 0239)  . cefTRIAXone (ROCEPHIN)  IV Stopped (08/28/17 2329)  . diltiazem (CARDIZEM) infusion 5 mg/hr (08/28/17 2113)  . heparin 1,150 Units/hr (08/29/17 0522)  . potassium chloride       LOS: 0 days     Darlin Droparole N Hall, MD [facility] Pager [phone]  If 7PM-7AM, please contact night-coverage [email or web address] Password [email or web address] 08/29/2017, 7:46 AM

## 2017-08-29 NOTE — Plan of Care (Signed)
  Safety: Ability to remain free from injury will improve 08/29/2017 2038 - Progressing by Ruel FavorsBrown-Staples, Venus Gilles, RN   Skin Integrity: Risk for impaired skin integrity will decrease 08/29/2017 2038 - Progressing by Ruel FavorsBrown-Staples, Shiree Altemus, RN

## 2017-08-29 NOTE — Evaluation (Signed)
Physical Therapy Evaluation Patient Details Name: Patricia Payne MRN: [id number] DOB: 10/03/1922 Today's Date: 08/29/2017   History of Present Illness  Pt is a 83 y.o. female who presented with altered mental status and weakness. Had been seen in ED day before admission for potential UTI but work-up was negative and pt was sent home where she became increasingly lethargic, confused, and having slurred speech. PMH significant for hypertension. CT negative and MRI pending.   Clinical Impression  Pt admitted with above diagnosis and presents to PT with functional limitations due to deficits listed below (See PT problem list). Pt needs skilled PT to maximize independence and safety to allow discharge to SNF. Pt lives alone and elderly family members have been struggling to assist pt as her mobility has declined.     Follow Up Recommendations SNF    Equipment Recommendations  None recommended by PT    Recommendations for Other Services       Precautions / Restrictions Precautions Precautions: Fall Restrictions Weight Bearing Restrictions: No      Mobility  Bed Mobility Overal bed mobility: Needs Assistance Bed Mobility: Rolling;Supine to Sit;Sit to Supine Rolling: Total assist   Supine to sit: Total assist Sit to supine: Total assist   General bed mobility comments: Assist for all aspects with pt staying rigid in extension  Transfers                 General transfer comment: Attempted sit to stand but unable with 1 person  Ambulation/Gait                Stairs            Wheelchair Mobility    Modified Rankin (Stroke Patients Only)       Balance Overall balance assessment: Needs assistance Sitting-balance support: Feet supported;Bilateral upper extremity supported Sitting balance-Leahy Scale: Poor Sitting balance - Comments: Pt sat EOB x 10-12 minutes with mod assist initially with posterior lean and improved to min guard. Pt fearful of  falling and initially pushing into extension. Postural control: Posterior lean                                   Pertinent Vitals/Pain Pain Assessment: No/denies pain    Home Living Family/patient expects to be discharged to:: Private residence Living Arrangements: Alone Available Help at Discharge: Family;Available 24 hours/day Type of Home: House Home Access: Stairs to enter Entrance Stairs-Rails: Right Entrance Stairs-Number of Steps: 3-4 Home Layout: One level Home Equipment: Walker - 2 wheels;Cane - single point;Toilet riser Additional Comments: Pt lives alone but family has had to stay with her off and on over the past 3 months and continuously over the past 3 weeks.     Prior Function Level of Independence: Needs assistance   Gait / Transfers Assistance Needed: Had been using a cane or walker but over the past 3 weeks significant decline in mobility and family has been unable to get pt up to the bathroom.   ADL's / Homemaking Assistance Needed: Requires assistance for meal prep. Until the past 3 weeks, was completing majority of ADL on her own but now needing total assistance.         Hand Dominance        Extremity/Trunk Assessment   Upper Extremity Assessment Upper Extremity Assessment: Defer to OT evaluation    Lower Extremity Assessment Lower Extremity Assessment: Generalized weakness;RLE deficits/detail;LLE deficits/detail RLE  Deficits / Details: Decr knee ROM with flex only to ~75 degrees LLE Deficits / Details: Decr knee ROM with flex only to ~75 degrees       Communication   Communication: No difficulties;HOH  Cognition Arousal/Alertness: Awake/alert Behavior During Therapy: WFL for tasks assessed/performed Overall Cognitive Status: Impaired/Different from baseline Area of Impairment: Attention;Memory;Following commands;Safety/judgement;Awareness;Problem solving                   Current Attention Level: Sustained Memory:  Decreased short-term memory Following Commands: Follows one step commands consistently;Follows one step commands with increased time Safety/Judgement: Decreased awareness of safety;Decreased awareness of deficits Awareness: Intellectual Problem Solving: Slow processing;Decreased initiation General Comments: Pt pleasant but confused during session.       General Comments General comments (skin integrity, edema, etc.): Pt's sister present    Exercises     Assessment/Plan    PT Assessment Patient needs continued PT services  PT Problem List Decreased strength;Decreased range of motion;Decreased activity tolerance;Decreased balance;Decreased mobility;Decreased cognition;Decreased knowledge of use of DME       PT Treatment Interventions DME instruction;Gait training;Functional mobility training;Therapeutic activities;Therapeutic exercise;Balance training;Cognitive remediation;Patient/family education    PT Goals (Current goals can be found in the Care Plan section)  Acute Rehab PT Goals Patient Stated Goal: eat my lunch PT Goal Formulation: With patient/family Time For Goal Achievement: 09/12/17 Potential to Achieve Goals: Fair    Frequency Min 2X/week   Barriers to discharge Inaccessible home environment;Decreased caregiver support Stairs to enter. Lives alone. Elderly sister unable to provide physical assist pt requires    Co-evaluation               AM-PAC PT "6 Clicks" Daily Activity  Outcome Measure Difficulty turning over in bed (including adjusting bedclothes, sheets and blankets)?: Unable Difficulty moving from lying on back to sitting on the side of the bed? : Unable Difficulty sitting down on and standing up from a chair with arms (e.g., wheelchair, bedside commode, etc,.)?: Unable Help needed moving to and from a bed to chair (including a wheelchair)?: Total Help needed walking in hospital room?: Total Help needed climbing 3-5 steps with a railing? : Total 6  Click Score: 6    End of Session Equipment Utilized During Treatment: Gait belt Activity Tolerance: Patient tolerated treatment well Patient left: in bed;with call bell/phone within reach;with bed alarm set;with family/visitor present Nurse Communication: Mobility status PT Visit Diagnosis: Other abnormalities of gait and mobility (R26.89);Muscle weakness (generalized) (M62.81);Difficulty in walking, not elsewhere classified (R26.2)    Time: [id number] PT Time Calculation (min) (ACUTE ONLY): 28 min   Charges:   PT Evaluation $PT Eval Moderate Complexity: 1 Mod PT Treatments $Therapeutic Activity: 8-22 mins   PT G Codes:   PT G-Codes **NOT FOR INPATIENT CLASS** Functional Assessment Tool Used: AM-PAC 6 Clicks Basic Mobility Functional Limitation: Mobility: Walking and moving around Mobility: Walking and Moving Around Current Status ([id number]): 100 percent impaired, limited or restricted Mobility: Walking and Moving Around Goal Status ([id number]([id number]): At least 60 percent but less than 80 percent impaired, limited or restricted    [facility] CenterCary Tikisha Molinaro PT [phone]   Angelina OkCary W Alt[facility] CenterMaycok 08/29/2017, 3:24 PM

## 2017-08-29 NOTE — Progress Notes (Signed)
  Echocardiogram 2D Echocardiogram has been performed.  Roosvelt MaserLane, Song Myre F 08/29/2017, 11:20 AM

## 2017-08-29 NOTE — Progress Notes (Signed)
ANTICOAGULATION CONSULT NOTE  Pharmacy Consult for Heparin Indication: atrial fibrillation  No Known Allergies  Patient Measurements: Height: 5\' 4"  (162.6 cm) Weight: 135 lb (61.2 kg) IBW/kg (Calculated) : 54.7 Heparin Dosing Weight: 61 kg  Vital Signs: Temp: 98 F (36.7 C) (11/28 0017) Temp Source: Axillary (11/28 0017) BP: 141/80 (11/28 0017) Pulse Rate: 67 (11/28 0017)  Labs: Recent Labs    08/27/17 1800  08/27/17 2225 08/28/17 0555 08/28/17 1003 08/28/17 1510 08/28/17 1648 08/29/17 0341 08/29/17 0407  HGB 11.6*   < > 9.9* 10.7*  --   --   --  9.8*  --   HCT 34.8*   < > 29.8* 32.7*  --   --   --  30.1*  --   PLT 205  --  180 174  --   --   --  193  --   APTT 27  --   --   --   --   --   --   --   --   LABPROT 15.0  --   --   --   --   --   --   --   --   INR 1.19  --   --   --   --   --   --   --   --   HEPARINUNFRC  --   --   --   --   --   --  <0.10*  --  0.23*  CREATININE 0.72   < > 0.82 0.81  --  0.93 0.92  --   --   TROPONINI  --   --   --  0.06* 0.07* 0.42*  --   --   --    < > = values in this interval not displayed.    Estimated Creatinine Clearance: 33.7 mL/min (by C-G formula based on SCr of 0.92 mg/dL).  Assessment: 83 y.o. female with Afib for heparin  Goal of Therapy:  Heparin level 0.3-0.7 units/ml Monitor platelets by anticoagulation protocol: Yes   Plan:  Increase Heparin  1150 units/hr Check heparin level in 8 hours.   Geannie RisenGreg Mehak Roskelley, PharmD, BCPS   08/29/2017 4:55 AM

## 2017-08-29 NOTE — Progress Notes (Signed)
ANTICOAGULATION CONSULT NOTE  Pharmacy Consult for Heparin Indication: atrial fibrillation  No Known Allergies  Patient Measurements: Height: 5\' 4"  (162.6 cm) Weight: 136 lb 14.4 oz (62.1 kg) IBW/kg (Calculated) : 54.7 Heparin Dosing Weight: 61 kg  Vital Signs: Temp: 98.1 F (36.7 C) (11/28 1955) Temp Source: Oral (11/28 1955) BP: 172/49 (11/28 2000) Pulse Rate: 58 (11/28 2000)  Labs: Recent Labs    08/27/17 1800  08/27/17 2225  08/28/17 0555 08/28/17 1003 08/28/17 1510  08/28/17 1648 08/29/17 0341 08/29/17 0407 08/29/17 0801 08/29/17 1058 08/29/17 2210  HGB 11.6*   < > 9.9*  --  10.7*  --   --   --   --  9.8*  --   --   --   --   HCT 34.8*   < > 29.8*  --  32.7*  --   --   --   --  30.1*  --   --   --   --   PLT 205  --  180  --  174  --   --   --   --  193  --   --   --   --   APTT 27  --   --   --   --   --   --   --   --   --   --   --   --   --   LABPROT 15.0  --   --   --   --   --   --   --   --   --   --   --   --   --   INR 1.19  --   --   --   --   --   --   --   --   --   --   --   --   --   HEPARINUNFRC  --   --   --   --   --   --   --    < > <0.10*  --  0.23*  --  0.18* 0.43  CREATININE 0.72   < > 0.82  --  0.81  --  0.93  --  0.92 0.74  --   --   --   --   TROPONINI  --   --   --    < > 0.06* 0.07* 0.42*  --   --   --   --  0.17*  --   --    < > = values in this interval not displayed.    Estimated Creatinine Clearance: 38.7 mL/min (by C-G formula based on SCr of 0.74 mg/dL).   Assessment: 83 y.o female admitted on 08/27/17 with altered mental status. CT head was unremarkable. She is noted with AFib, not on anticoagulation prior to admit. Now on IV heparin -heparin level at goal on 1300 units/hr  Goal of Therapy:  Heparin level 0.3-0.7 units/ml Monitor platelets by anticoagulation protocol: Yes   Plan:  -No heparin changes needed -Daily heparin level and CBC  Harland GermanAndrew Ronya Gilcrest, Pharm D 08/29/2017 10:57 PM

## 2017-08-29 NOTE — Progress Notes (Signed)
ANTICOAGULATION CONSULT NOTE  Pharmacy Consult for Heparin Indication: atrial fibrillation  No Known Allergies  Patient Measurements: Height: 5\' 4"  (162.6 cm) Weight: 136 lb 14.4 oz (62.1 kg) IBW/kg (Calculated) : 54.7 Heparin Dosing Weight: 61 kg  Vital Signs: Temp: 98.2 F (36.8 C) (11/28 1200) Temp Source: Oral (11/28 1200) BP: 178/55 (11/28 1200) Pulse Rate: 64 (11/28 1200)  Labs: Recent Labs    08/27/17 1800  08/27/17 2225  08/28/17 0555 08/28/17 1003 08/28/17 1510 08/28/17 1648 08/29/17 0341 08/29/17 0407 08/29/17 0801 08/29/17 1058  HGB 11.6*   < > 9.9*  --  10.7*  --   --   --  9.8*  --   --   --   HCT 34.8*   < > 29.8*  --  32.7*  --   --   --  30.1*  --   --   --   PLT 205  --  180  --  174  --   --   --  193  --   --   --   APTT 27  --   --   --   --   --   --   --   --   --   --   --   LABPROT 15.0  --   --   --   --   --   --   --   --   --   --   --   INR 1.19  --   --   --   --   --   --   --   --   --   --   --   HEPARINUNFRC  --   --   --   --   --   --   --  <0.10*  --  0.23*  --  0.18*  CREATININE 0.72   < > 0.82  --  0.81  --  0.93 0.92 0.74  --   --   --   TROPONINI  --   --   --    < > 0.06* 0.07* 0.42*  --   --   --  0.17*  --    < > = values in this interval not displayed.    Estimated Creatinine Clearance: 38.7 mL/min (by C-G formula based on SCr of 0.74 mg/dL).   Assessment: 83 y.o female admitted on 08/27/17 with altered mental status. CT head was unremarkable. MRI brain ordered but not yet completed.  With AFib, not on anticoagulation prior to admit. Now on IV heparin- has had low levels x3, most recently 0.18 units/mL on 1150 units/hr. This level dropped despite rate increase overnight- spoke with RN Koleen NimrodAdrian, and there have not been any issues with the heparin infusion today that would explain decrease.  Hgb and plts overall stable, no bleeding noted.  Goal of Therapy:  Heparin level 0.3-0.7 units/ml Monitor platelets by  anticoagulation protocol: Yes   Plan:  -Heparin 2000 unit bolus x1, then increase infusion to 1300 units/hr -Heparin level in 8 hours -Daily heparin level and CBC  Addalie Calles D. Paris Hohn, PharmD, BCPS Clinical Pharmacist Clinical Phone for 08/29/2017 until 3:30pm: [phone] If after 3:30pm, please call main pharmacy at [phone] 08/29/2017 1:43 PM

## 2017-08-30 DIAGNOSIS — I10 Essential (primary) hypertension: Secondary | ICD-10-CM

## 2017-08-30 LAB — BASIC METABOLIC PANEL
ANION GAP: 8 (ref 5–15)
BUN: 16 mg/dL (ref 6–20)
CO2: 27 mmol/L (ref 22–32)
Calcium: 8.4 mg/dL — ABNORMAL LOW (ref 8.9–10.3)
Chloride: 105 mmol/L (ref 101–111)
Creatinine, Ser: 0.73 mg/dL (ref 0.44–1.00)
Glucose, Bld: 127 mg/dL — ABNORMAL HIGH (ref 65–99)
POTASSIUM: 3 mmol/L — AB (ref 3.5–5.1)
Sodium: 140 mmol/L (ref 135–145)

## 2017-08-30 LAB — CBC
HEMATOCRIT: 29 % — AB (ref 36.0–46.0)
Hemoglobin: 9.7 g/dL — ABNORMAL LOW (ref 12.0–15.0)
MCH: 30.5 pg (ref 26.0–34.0)
MCHC: 33.4 g/dL (ref 30.0–36.0)
MCV: 91.2 fL (ref 78.0–100.0)
PLATELETS: 217 10*3/uL (ref 150–400)
RBC: 3.18 MIL/uL — AB (ref 3.87–5.11)
RDW: 13.7 % (ref 11.5–15.5)
WBC: 7.9 10*3/uL (ref 4.0–10.5)

## 2017-08-30 LAB — HEPARIN LEVEL (UNFRACTIONATED): Heparin Unfractionated: 0.43 IU/mL (ref 0.30–0.70)

## 2017-08-30 LAB — GLUCOSE, CAPILLARY: GLUCOSE-CAPILLARY: 152 mg/dL — AB (ref 65–99)

## 2017-08-30 MED ORDER — POTASSIUM CHLORIDE CRYS ER 20 MEQ PO TBCR
40.0000 meq | EXTENDED_RELEASE_TABLET | Freq: Once | ORAL | Status: AC
  Administered 2017-08-30: 40 meq via ORAL
  Filled 2017-08-30: qty 2

## 2017-08-30 MED ORDER — AZITHROMYCIN 500 MG PO TABS
500.0000 mg | ORAL_TABLET | ORAL | Status: DC
  Administered 2017-08-30 – 2017-09-01 (×3): 500 mg via ORAL
  Filled 2017-08-30 (×3): qty 1

## 2017-08-30 NOTE — Progress Notes (Signed)
PROGRESS NOTE  Ernst BreachMildred C Sandora YQM:[id number] DOB: 10/21/1922 DOA: 08/27/2017 PCP: Patient, No Pcp Per  HPI/Recap of past 24 hours: [name] with hx of HTN, HLD from home who lives alone presented with altered mental status on evening of 08/27/17. Suspected CAP, present on admission complicated by a-fib RvR and pulmonary edema. Had rapid response called yesterday morning 08/28/17 due to A-fib rvr with rate in the 160's cardizem drip and heparin drip initiated. On IV antibiotics for CAP.  No acute events reported overnight. This morning the patient is alert but mildly confused. Denies any chest pain or dyspnea.  Assessment/Plan: Active Problems:   Acute encephalopathy   HTN (hypertension)   Atrial fibrillation with RVR (HCC)   Hypokalemia   Hypomagnesemia  1. Altered mental status, waxes and wanes -most likely 2/2 to sepsis, CAP, present on admission -continue IV ceftriaxone and IV azithromycin, day #3 -continue duonebs prn -continue O2 supplementation to maintain O2 sat 92% or greater -fall precaution -reorient as needed    2. Community acquired PNA, present on admission -management as stated above -swallow eval to r/o possible dysphagia -ABG no sign of hypoxemia on 2L [place]  3. Afib with RvR -diltiazem drip as needed -rate controlled -heparin drip  -telemetry  -CHADSVASC score 5 -High fall risk -Has bled score 2 moderate risk of major bleeding  4. Ambulatory dysfunction -PT to evaluate and treat -may need SNF for physical rehab-lives alone  5. Hypertension  -resume losartan if she passes swallow evaluation -prn IV hydralazine for sbp >180 or dbp>105  6. HLD -resume simvastatin if passes swallow evaluation  7. Chronic normocytic anemia -No sign of active bleeding- -hg stable 9.0 -baseline hg 10 -CBC am  8. Elevated troponin -most likely 2/2 to afib rvr -peaked at 0.4 trending down to 0.17 -No ST T elevation. EKG personally reviewed. -2D  echo done today 08/29/17 LVEF 55-60%  9. Anxiety -stable -xanax  10. Restless leg syndrome -ropinirole  11. chronic constipation -sennokot  12. Hypokalemia -improving -K+ 3.0 from 2.8 -replete as indicated -BMP am   Code Status: DNR  Family Communication: No family members at bedside.  Disposition Plan: Will stay another midnight to continue IV antibiotics.   Consultants:  None   Procedures:  None  Antimicrobials:  IV ceftriaxone and IV azithromycin day # 3  DVT prophylaxis:  heparin drip due to a-fib with rvr   Objective: Vitals:   08/30/17 0000 08/30/17 0312 08/30/17 0500 08/30/17 0800  BP: (!) 135/52 (!) 156/88  (!) 161/74  Pulse: (!) 44 79  69  Resp: (!) 28 (!) 22  (!) 26  Temp: 98.6 F (37 C) 97.9 F (36.6 C)    TempSrc: Axillary Oral    SpO2: 94% 94%  98%  Weight:   60.6 kg (133 lb 9.6 oz)   Height:        Intake/Output Summary (Last 24 hours) at 08/30/2017 0855 Last data filed at 08/30/2017 0728 Gross per 24 hour  Intake 557.75 ml  Output 1700 ml  Net -1142.25 ml   Filed Weights   08/27/17 1812 08/29/17 0500 08/30/17 0500  Weight: 61.2 kg (135 lb) 62.1 kg (136 lb 14.4 oz) 60.6 kg (133 lb 9.6 oz)    Exam:   General:  83 yo CF thin built laying in bed in NAD. Alert but confused  Cardiovascular: IRRR with no rubs or gallops   Respiratory: Mild crackles at bases bilaterally. No wheezes noted  Abdomen:  soft NT ND NBS x4 quadrants  Musculoskeletal: Moves all limbs. 2/4 pulses in all 4  Skin: No noted open lesions  Psychiatry: Mood is appropriate for condition and setting   Data Reviewed: CBC: Recent Labs  Lab 08/27/17 1800 08/27/17 1822 08/27/17 2225 08/28/17 0555 08/29/17 0341 08/30/17 0226  WBC 11.1*  --  9.7 9.3 8.7 7.9  NEUTROABS 8.3*  --   --   --   --   --   HGB 11.6* 11.9* 9.9* 10.7* 9.8* 9.7*  HCT 34.8* 35.0* 29.8* 32.7* 30.1* 29.0*  MCV 91.6  --  91.4 92.1 90.7 91.2  PLT 205  --  180 174 193 217   Basic  Metabolic Panel: Recent Labs  Lab 08/28/17 0555 08/28/17 1003 08/28/17 1510 08/28/17 1648 08/29/17 0341 08/30/17 0226  NA 139  --  NOT DONE 138 138 140  K 2.8*  --  NOT DONE 3.3* 2.8* 3.0*  CL 104  --  NOT DONE 106 106 105  CO2 24  --  NOT DONE 22 24 27   GLUCOSE 99  --  134* 195* 135* 127*  BUN 21*  --  NOT DONE 24* 23* 16  CREATININE 0.81  --  0.93 0.92 0.74 0.73  CALCIUM 8.9  --  NOT DONE 8.4* 8.5* 8.4*  MG  --  1.5* NOT DONE 1.8  --   --    GFR: Estimated Creatinine Clearance: 38.7 mL/min (by C-G formula based on SCr of 0.73 mg/dL). Liver Function Tests: Recent Labs  Lab 08/26/17 0501 08/27/17 1800 08/28/17 0555  AST 40 49* 43*  ALT 28 23 32  ALKPHOS 90 80 76  BILITOT 0.8 2.2* 0.5  PROT 7.7 6.1* 6.4*  ALBUMIN 3.9 2.6* 2.5*   No results for input(s): LIPASE, AMYLASE in the last 168 hours. Recent Labs  Lab 08/27/17 2149  AMMONIA 24   Coagulation Profile: Recent Labs  Lab 08/27/17 1800  INR 1.19   Cardiac Enzymes: Recent Labs  Lab 08/28/17 0555 08/28/17 1003 08/28/17 1510 08/29/17 0801  TROPONINI 0.06* 0.07* 0.42* 0.17*   BNP (last 3 results) No results for input(s): PROBNP in the last 8760 hours. HbA1C: No results for input(s): HGBA1C in the last 72 hours. CBG: Recent Labs  Lab 08/28/17 0140 08/28/17 2012 08/29/17 0831 08/30/17 0119  GLUCAP 170* 129* 111* 152*   Lipid Profile: No results for input(s): CHOL, HDL, LDLCALC, TRIG, CHOLHDL, LDLDIRECT in the last 72 hours. Thyroid Function Tests: Recent Labs    08/28/17 0555  TSH 0.799   Anemia Panel: Recent Labs    08/28/17 0555  VITAMINB12 599  FOLATE 23.3  FERRITIN 480*  TIBC 171*  IRON 25*  RETICCTPCT 1.3   Urine analysis:    Component Value Date/Time   COLORURINE YELLOW 08/27/2017 2225   APPEARANCEUR HAZY (A) 08/27/2017 2225   LABSPEC 1.020 08/27/2017 2225   PHURINE 6.0 08/27/2017 2225   GLUCOSEU NEGATIVE 08/27/2017 2225   HGBUR MODERATE (A) 08/27/2017 2225   BILIRUBINUR  NEGATIVE 08/27/2017 2225   KETONESUR 5 (A) 08/27/2017 2225   PROTEINUR >=300 (A) 08/27/2017 2225   NITRITE NEGATIVE 08/27/2017 2225   LEUKOCYTESUR NEGATIVE 08/27/2017 2225   Sepsis Labs: @LABRCNTIP (procalcitonin:4,lacticidven:4)  ) Recent Results (from the past 240 hour(s))  Urine culture     Status: None   Collection Time: 08/27/17 10:25 PM  Result Value Ref Range Status   Specimen Description URINE, CATHETERIZED  Final   Special Requests NONE  Final   Culture NO GROWTH  Final   Report Status 08/29/2017 FINAL  Final      Studies: Mr Brain Wo Contrast  Result Date: 08/29/2017 CLINICAL DATA:  Altered mental status EXAM: MRI HEAD WITHOUT CONTRAST TECHNIQUE: Multiplanar, multiecho pulse sequences of the brain and surrounding structures were obtained without intravenous contrast. COMPARISON:  Head CT 08/27/2017 FINDINGS: Brain: The midline structures are normal. There is no acute infarct or acute hemorrhage. No mass lesion, hydrocephalus, dural abnormality or extra-axial collection. Very mild periventricular white matter hyperintensity. There is generalized atrophy, but not particularly age advanced and with no lobar predilection. No chronic microhemorrhage or superficial siderosis. Vascular: Major intracranial arterial and venous sinus flow voids are preserved. Skull and upper cervical spine: The visualized skull base, calvarium, upper cervical spine and extracranial soft tissues are normal. Sinuses/Orbits: No fluid levels or advanced mucosal thickening. No mastoid or middle ear effusion. Normal orbits. IMPRESSION: Mild atrophy for age.  No acute abnormality. Electronically Signed   By: Deatra Robinson M.D.   On: 08/29/2017 18:57    Scheduled Meds: . ALPRAZolam  0.125 mg Oral BID   And  . ALPRAZolam  0.25 mg Oral QHS  . rOPINIRole  1 mg Oral QHS  . senna-docusate  1 tablet Oral BID    Continuous Infusions: . azithromycin Stopped (08/29/17 2341)  . cefTRIAXone (ROCEPHIN)  IV Stopped  (08/29/17 2103)  . diltiazem (CARDIZEM) infusion 5 mg/hr (08/30/17 0305)  . heparin 1,300 Units/hr (08/29/17 1426)     LOS: 1 day     Darlin Drop, MD [facility] Pager [phone]  If 7PM-7AM, please contact night-coverage [email or web address] Password [email or web address] 08/30/2017, 8:55 AM

## 2017-08-30 NOTE — Progress Notes (Signed)
ANTICOAGULATION CONSULT NOTE  Pharmacy Consult for Heparin Indication: atrial fibrillation  No Known Allergies  Patient Measurements: Height: 5\' 4"  (162.6 cm) Weight: 133 lb 9.6 oz (60.6 kg) IBW/kg (Calculated) : 54.7 Heparin Dosing Weight: 61 kg  Vital Signs: Temp: 97.9 F (36.6 C) (11/29 0312) Temp Source: Oral (11/29 0312) BP: 161/74 (11/29 0800) Pulse Rate: 69 (11/29 0800)  Labs: Recent Labs    08/27/17 1800  08/28/17 0555 08/28/17 1003 08/28/17 1510 08/28/17 1648 08/29/17 0341  08/29/17 0801 08/29/17 1058 08/29/17 2210 08/30/17 0226  HGB 11.6*   < > 10.7*  --   --   --  9.8*  --   --   --   --  9.7*  HCT 34.8*   < > 32.7*  --   --   --  30.1*  --   --   --   --  29.0*  PLT 205   < > 174  --   --   --  193  --   --   --   --  217  APTT 27  --   --   --   --   --   --   --   --   --   --   --   LABPROT 15.0  --   --   --   --   --   --   --   --   --   --   --   INR 1.19  --   --   --   --   --   --   --   --   --   --   --   HEPARINUNFRC  --   --   --   --   --  <0.10*  --    < >  --  0.18* 0.43 0.43  CREATININE 0.72   < > 0.81  --  0.93 0.92 0.74  --   --   --   --  0.73  TROPONINI  --    < > 0.06* 0.07* 0.42*  --   --   --  0.17*  --   --   --    < > = values in this interval not displayed.   Estimated Creatinine Clearance: 38.7 mL/min (by C-G formula based on SCr of 0.73 mg/dL).  Assessment: 83 y.o female admitted on 08/27/17 with altered mental status. CT head was unremarkable. She is noted with AFib, not on anticoagulation prior to admit. Now on IV heparin. HgB 9.7, CBC stable overnight with NO documented bleeding.  Heparin level therapeutic: 0.43  Goal of Therapy:  Heparin level 0.3-0.7 units/ml Monitor platelets by anticoagulation protocol: Yes   Plan:  - Continue hepatin infusion at 1300 units/hr - Daily heparin level and CBC - Monitor for s/sx of bleeding  Ruben Imony Orley Lawry, PharmD Clinical Pharmacist 08/30/2017 10:34 AM

## 2017-08-31 LAB — CBC
HEMATOCRIT: 29.2 % — AB (ref 36.0–46.0)
Hemoglobin: 9.4 g/dL — ABNORMAL LOW (ref 12.0–15.0)
MCH: 29.5 pg (ref 26.0–34.0)
MCHC: 32.2 g/dL (ref 30.0–36.0)
MCV: 91.5 fL (ref 78.0–100.0)
PLATELETS: 234 10*3/uL (ref 150–400)
RBC: 3.19 MIL/uL — ABNORMAL LOW (ref 3.87–5.11)
RDW: 13.8 % (ref 11.5–15.5)
WBC: 8.9 10*3/uL (ref 4.0–10.5)

## 2017-08-31 LAB — BASIC METABOLIC PANEL
ANION GAP: 5 (ref 5–15)
BUN: 18 mg/dL (ref 6–20)
CHLORIDE: 106 mmol/L (ref 101–111)
CO2: 26 mmol/L (ref 22–32)
Calcium: 8.7 mg/dL — ABNORMAL LOW (ref 8.9–10.3)
Creatinine, Ser: 0.76 mg/dL (ref 0.44–1.00)
GFR calc Af Amer: >60 mL/min (ref >60)
GLUCOSE: 134 mg/dL — AB (ref 65–99)
POTASSIUM: 3.8 mmol/L (ref 3.5–5.1)
Sodium: 137 mmol/L (ref 135–145)

## 2017-08-31 LAB — GLUCOSE, CAPILLARY
GLUCOSE-CAPILLARY: 135 mg/dL — AB (ref 65–99)
Glucose-Capillary: 152 mg/dL — ABNORMAL HIGH (ref 65–99)
Glucose-Capillary: 156 mg/dL — ABNORMAL HIGH (ref 65–99)

## 2017-08-31 LAB — HEPARIN LEVEL (UNFRACTIONATED): HEPARIN UNFRACTIONATED: 0.53 [IU]/mL (ref 0.30–0.70)

## 2017-08-31 MED ORDER — LOSARTAN POTASSIUM 25 MG PO TABS
25.0000 mg | ORAL_TABLET | Freq: Every day | ORAL | Status: DC
  Administered 2017-08-31 – 2017-09-01 (×2): 25 mg via ORAL
  Filled 2017-08-31 (×2): qty 1

## 2017-08-31 MED ORDER — ALUM & MAG HYDROXIDE-SIMETH 200-200-20 MG/5ML PO SUSP
15.0000 mL | Freq: Four times a day (QID) | ORAL | Status: DC | PRN
  Administered 2017-08-31: 15 mL via ORAL
  Filled 2017-08-31: qty 30

## 2017-08-31 MED ORDER — DILTIAZEM HCL ER COATED BEADS 240 MG PO CP24
240.0000 mg | ORAL_CAPSULE | Freq: Every day | ORAL | Status: DC
  Administered 2017-08-31 – 2017-09-03 (×4): 240 mg via ORAL
  Filled 2017-08-31 (×4): qty 1

## 2017-08-31 MED ORDER — RIVAROXABAN 20 MG PO TABS
20.0000 mg | ORAL_TABLET | Freq: Every day | ORAL | Status: DC
  Administered 2017-08-31: 20 mg via ORAL
  Filled 2017-08-31: qty 1

## 2017-08-31 MED ORDER — SIMVASTATIN 20 MG PO TABS: 20.0000 mg | ORAL_TABLET | Freq: Every day | ORAL | Status: DC

## 2017-08-31 NOTE — Progress Notes (Signed)
ANTICOAGULATION CONSULT NOTE  Pharmacy Consult for Heparin Indication: atrial fibrillation  No Known Allergies  Patient Measurements: Height: 5\' 4"  (162.6 cm) Weight: 136 lb 3.9 oz (61.8 kg) IBW/kg (Calculated) : 54.7 Heparin Dosing Weight: 61 kg  Vital Signs: Temp: 98.7 F (37.1 C) (11/30 0400) Temp Source: Oral (11/30 0400) BP: 151/73 (11/30 0400) Pulse Rate: 67 (11/30 0400)  Labs: Recent Labs    08/28/17 1510  08/29/17 0341  08/29/17 0801  08/29/17 2210 08/30/17 0226 08/31/17 0242  HGB  --    < > 9.8*  --   --   --   --  9.7* 9.4*  HCT  --   --  30.1*  --   --   --   --  29.0* 29.2*  PLT  --   --  193  --   --   --   --  217 234  HEPARINUNFRC  --    < >  --    < >  --    < > 0.43 0.43 0.53  CREATININE 0.93   < > 0.74  --   --   --   --  0.73 0.76  TROPONINI 0.42*  --   --   --  0.17*  --   --   --   --    < > = values in this interval not displayed.   Estimated Creatinine Clearance: 38.7 mL/min (by C-G formula based on SCr of 0.76 mg/dL).  Assessment: 83 y.o female admitted on 08/27/17 with altered mental status. CT head was unremarkable. She is noted with AFib, not on anticoagulation prior to admit. Now on IV heparin. HgB 9.4, CBC remains stable with NO documented bleeding.  Heparin level therapeutic: 0.53  Goal of Therapy:  Heparin level 0.3-0.7 units/ml Monitor platelets by anticoagulation protocol: Yes   Plan:  - Continue hepatin infusion at 1300 units/hr - Daily heparin level and CBC - Monitor for s/sx of bleeding  Ruben Imony Ichelle Harral, PharmD Clinical Pharmacist 08/31/2017 10:38 AM

## 2017-08-31 NOTE — Progress Notes (Signed)
  Speech Language Pathology Treatment: Dysphagia;Cognitive-Linquistic  Patient Details Name: Patricia Payne MRN: [id number] DOB: 11/09/1922 Today's Date: 08/31/2017 Time: 2563-8937 SLP Time Calculation (min) (ACUTE ONLY): 14 min  Assessment / Plan / Recommendation Clinical Impression  Pt seen for dysphagia and cognitive abilities. Denture cream donned to upper plate with good adhesion and adequate and functional mastication and transit. No s/s aspiration with liquids or solids. Pt states at home she uses writing as strategy to recall important information. Plans are for pt to transfer to SNF for therapies.  Recommend she continue with regular texture, thin liquids and further ST at SNF for cognitive impairments if plan is for pt to return home after rehab.    HPI HPI: Patricia Payne a 83 y.o.femalewithhistory of hypertension hyperlipidemia who usually lives alone was found with the family to be increasingly confused last few days. Patient was brought to the ER at [facility] on August 26, 2017 and workup was negative for any infectious process. Pt did not show improvement and mental status has been progressively declining so patient returned to the ER. Upon admit, pt confused and lethargic; not following commands. CT head was unremarkable; MRI brain ordered. Chest x-ray shows possible pulmonary edema versus infectious process. Pt failed swallow screen.       SLP Plan  All goals met;Discharge SLP treatment due to (comment)       Recommendations  Diet recommendations: Regular;Thin liquid Liquids provided via: Cup;Straw Medication Administration: Whole meds with puree Supervision: Patient able to self feed;Intermittent supervision to cue for compensatory strategies Compensations: Minimize environmental distractions;Slow rate;Small sips/bites Postural Changes and/or Swallow Maneuvers: Seated upright 90 degrees                Oral Care Recommendations: Oral care  BID Follow up Recommendations: Skilled Nursing facility SLP Visit Diagnosis: Dysphagia, unspecified (R13.10);Cognitive communication deficit (R41.841) Plan: All goals met;Discharge SLP treatment due to (comment)       GO                Houston Siren 08/31/2017, 1:31 PM

## 2017-08-31 NOTE — Progress Notes (Signed)
Physical Therapy Treatment Patient Details Name: Patricia BreachMildred C Vandenberghe MRN: [id number] DOB: 06/16/1923 Today's Date: 08/31/2017    History of Present Illness Pt is a 83 y.o. female who presented with altered mental status and weakness. Had been seen in ED day before admission for potential UTI but work-up was negative and pt was sent home where she became increasingly lethargic, confused, and having slurred speech. PMH significant for hypertension. CT negative and MRI pending.     PT Comments    Pt making steady progress with mobility. Less rigid today and able to bet OOB to chair.   Follow Up Recommendations  SNF     Equipment Recommendations  None recommended by PT    Recommendations for Other Services       Precautions / Restrictions Precautions Precautions: Fall Restrictions Weight Bearing Restrictions: No    Mobility  Bed Mobility Overal bed mobility: Needs Assistance Bed Mobility: Supine to Sit     Supine to sit: Max assist     General bed mobility comments: Assist to bring legs to EOB, elevate trunk into sitting, and bring hips to EOB.  Transfers Overall transfer level: Needs assistance Equipment used: Ambulation equipment used Transfers: Sit to/from Stand Sit to Stand: +2 physical assistance;Max assist         General transfer comment: Assist to bring hips and trunk up. Pt with lean to the rt. Used [employer]. Pivoted pt to the chair with Stedy.  Ambulation/Gait             General Gait Details: Unable   Stairs            Wheelchair Mobility    Modified Rankin (Stroke Patients Only)       Balance Overall balance assessment: Needs assistance Sitting-balance support: Feet supported;Bilateral upper extremity supported Sitting balance-Leahy Scale: Poor Sitting balance - Comments: Pt sat EOB x 7-8 minutes with min guard to min assist. Verbal cues to correct rt lateral lean. Postural control: Right lateral lean Standing balance support:  Bilateral upper extremity supported Standing balance-Leahy Scale: Zero Standing balance comment: +2 mod assist with stedy to maintain standing. Rt hip and knee remain flexed                            Cognition Arousal/Alertness: Awake/alert Behavior During Therapy: WFL for tasks assessed/performed Overall Cognitive Status: Impaired/Different from baseline Area of Impairment: Attention;Memory;Following commands;Safety/judgement;Awareness;Problem solving                   Current Attention Level: Sustained Memory: Decreased short-term memory Following Commands: Follows one step commands consistently;Follows one step commands with increased time Safety/Judgement: Decreased awareness of safety;Decreased awareness of deficits Awareness: Intellectual Problem Solving: Slow processing;Decreased initiation;Requires verbal cues        Exercises      General Comments        Pertinent Vitals/Pain Pain Assessment: No/denies pain    Home Living                      Prior Function            PT Goals (current goals can now be found in the care plan section) Progress towards PT goals: Progressing toward goals    Frequency    Min 2X/week      PT Plan Current plan remains appropriate    Co-evaluation              AM-PAC  PT "6 Clicks" Daily Activity  Outcome Measure  Difficulty turning over in bed (including adjusting bedclothes, sheets and blankets)?: Unable Difficulty moving from lying on back to sitting on the side of the bed? : Unable Difficulty sitting down on and standing up from a chair with arms (e.g., wheelchair, bedside commode, etc,.)?: Unable Help needed moving to and from a bed to chair (including a wheelchair)?: Total Help needed walking in hospital room?: Total Help needed climbing 3-5 steps with a railing? : Total 6 Click Score: 6    End of Session Equipment Utilized During Treatment: Gait belt Activity Tolerance: Patient  tolerated treatment well Patient left: with call bell/phone within reach;with family/visitor present;in chair Nurse Communication: Mobility status;Need for lift equipment PT Visit Diagnosis: Other abnormalities of gait and mobility (R26.89);Muscle weakness (generalized) (M62.81);Difficulty in walking, not elsewhere classified (R26.2)     Time: 1041-1101 PT Time Calculation (min) (ACUTE ONLY): 20 min  Charges:  $Therapeutic Activity: 8-22 mins                    G Codes:       [facility] Knoah Nedeau PT [phone]    Angelina OkCary W Kunes[facility] CenterMaycok 08/31/2017, 5:52 PM

## 2017-08-31 NOTE — Plan of Care (Signed)
Out of bed to chair with PT, patient tolerated well.

## 2017-08-31 NOTE — Progress Notes (Signed)
PROGRESS NOTE  DIDI GANAWAY ZOX:[id number] DOB: [birth year] DOA: 08/27/2017 PCP: Patient, No Pcp Per  HPI/Recap of past 24 hours: Ms. Osterlund is a 83 yo F with hx of HTN, HLD from home who lives alone presented with altered mental status on evening of 08/27/17. Suspected CAP, present on admission complicated by a-fib RvR and pulmonary edema. Had rapid response called yesterday morning 08/28/17 due to A-fib rvr with rate in the 160's cardizem drip and heparin drip initiated. On IV antibiotics for CAP.  No acute events reported overnight. Patient was seen and examined at her bedside. She has no complaints at this time. Denies chest pain, dyspnea or palpitations. Heart rate is well controlled on diltiazem drip. Cardiology consulted with recommendations appreciated.    Assessment/Plan: Active Problems:   Acute encephalopathy   HTN (hypertension)   Atrial fibrillation with RVR (HCC)   Hypokalemia   Hypomagnesemia  1. Altered mental status, waxes and wanes -most likely 2/2 to sepsis, CAP, present on admission -continue IV ceftriaxone and IV azithromycin, day #4 -continue duonebs prn -continue O2 supplementation to maintain O2 sat 92% or greater -fall precaution -reorient as needed    2. Community acquired PNA, present on admission -management as stated above -swallow eval to r/o possible dysphagia regular thin liquid -ABG no sign of hypoxemia on 2L [place]  3. New onset Afib with RvR -diltiazem drip stopped -po diltiazem 240 mg daily -rate controlled -heparin drip stopped -xarelto 20 mg daily started -telemetry  -CHADSVASC score 5 -echo 11/28 EF 55-60% -cardiology consulted -we appreciate cardiology, Dr. Jacinto Halim, recommendations  4. Ambulatory dysfunction -PT/OT -PT recommends SNF   5. Hypertension  -losartan  -prn IV hydralazine for sbp >180 or dbp>105  6. HLD -simvastatin  7. Chronic normocytic anemia -No sign of active bleeding- -hg stable 9.4 -baseline hg  10 -CBC am  8. Elevated troponin -most likely 2/2 to afib rvr -peaked at 0.4 (08/28/17) trending down to 0.17 -No ST T elevation. EKG personally reviewed. -2D echo done today 08/29/17 LVEF 55-60%  9. Anxiety -stable -xanax  10. Restless leg syndrome -ropinirole  11. chronic constipation -sennokot  12. Hypokalemia, resolved -K+ 3.8 from 3.0 from 2.8 -BMP am   Code Status: DNR  Family Communication: No family members at bedside.  Disposition Plan: Will stay another midnight to continue IV antibiotics.   Consultants:  None   Procedures:  None  Antimicrobials:  IV ceftriaxone and IV azithromycin day # 4/5  DVT prophylaxis:  xarelto for new onset a-fib   Objective: Vitals:   08/30/17 2345 08/30/17 2358 08/31/17 0400 08/31/17 0444  BP:   (!) 151/73   Pulse:  76 67   Resp:  (!) 29 (!) 21   Temp: 98.6 F (37 C)  98.7 F (37.1 C)   TempSrc: Oral  Oral   SpO2:  95% 100%   Weight:   61.8 kg (136 lb 3.9 oz) 61.8 kg (136 lb 3.9 oz)  Height:        Intake/Output Summary (Last 24 hours) at 08/31/2017 0802 Last data filed at 08/30/2017 2052 Gross per 24 hour  Intake -  Output 200 ml  Net -200 ml   Filed Weights   08/30/17 0500 08/31/17 0400 08/31/17 0444  Weight: 60.6 kg (133 lb 9.6 oz) 61.8 kg (136 lb 3.9 oz) 61.8 kg (136 lb 3.9 oz)    Exam:   General:  83 yo CF thin built laying in bed in NAD. Alert but confused  Cardiovascular: [facility]  with no rubs or gallops   Respiratory: Mild crackles at bases bilaterally. No wheezes noted  Abdomen: soft NT ND NBS x4 quadrants  Musculoskeletal: Moves all limbs. 2/4 pulses in all 4  Skin: No noted open lesions  Psychiatry: Mood is appropriate for condition and setting   Data Reviewed: CBC: Recent Labs  Lab 08/27/17 1800  08/27/17 2225 08/28/17 0555 08/29/17 0341 08/30/17 0226 08/31/17 0242  WBC 11.1*  --  9.7 9.3 8.7 7.9 8.9  NEUTROABS 8.3*  --   --   --   --   --   --   HGB 11.6*   < > 9.9*  10.7* 9.8* 9.7* 9.4*  HCT 34.8*   < > 29.8* 32.7* 30.1* 29.0* 29.2*  MCV 91.6  --  91.4 92.1 90.7 91.2 91.5  PLT 205  --  180 174 193 217 234   < > = values in this interval not displayed.   Basic Metabolic Panel: Recent Labs  Lab 08/28/17 1003 08/28/17 1510 08/28/17 1648 08/29/17 0341 08/30/17 0226 08/31/17 0242  NA  --  NOT DONE 138 138 140 137  K  --  NOT DONE 3.3* 2.8* 3.0* 3.8  CL  --  NOT DONE 106 106 105 106  CO2  --  NOT DONE 22 24 27 26   GLUCOSE  --  134* 195* 135* 127* 134*  BUN  --  NOT DONE 24* 23* 16 18  CREATININE  --  0.93 0.92 0.74 0.73 0.76  CALCIUM  --  NOT DONE 8.4* 8.5* 8.4* 8.7*  MG 1.5* NOT DONE 1.8  --   --   --    GFR: Estimated Creatinine Clearance: 38.7 mL/min (by C-G formula based on SCr of 0.76 mg/dL). Liver Function Tests: Recent Labs  Lab 08/26/17 0501 08/27/17 1800 08/28/17 0555  AST 40 49* 43*  ALT 28 23 32  ALKPHOS 90 80 76  BILITOT 0.8 2.2* 0.5  PROT 7.7 6.1* 6.4*  ALBUMIN 3.9 2.6* 2.5*   No results for input(s): LIPASE, AMYLASE in the last 168 hours. Recent Labs  Lab 08/27/17 2149  AMMONIA 24   Coagulation Profile: Recent Labs  Lab 08/27/17 1800  INR 1.19   Cardiac Enzymes: Recent Labs  Lab 08/28/17 0555 08/28/17 1003 08/28/17 1510 08/29/17 0801  TROPONINI 0.06* 0.07* 0.42* 0.17*   BNP (last 3 results) No results for input(s): PROBNP in the last 8760 hours. HbA1C: No results for input(s): HGBA1C in the last 72 hours. CBG: Recent Labs  Lab 08/28/17 0140 08/28/17 2012 08/29/17 0831 08/30/17 0119 08/31/17 0030  GLUCAP 170* 129* 111* 152* 156*   Lipid Profile: No results for input(s): CHOL, HDL, LDLCALC, TRIG, CHOLHDL, LDLDIRECT in the last 72 hours. Thyroid Function Tests: No results for input(s): TSH, T4TOTAL, FREET4, T3FREE, THYROIDAB in the last 72 hours. Anemia Panel: No results for input(s): VITAMINB12, FOLATE, FERRITIN, TIBC, IRON, RETICCTPCT in the last 72 hours. Urine analysis:    Component  Value Date/Time   COLORURINE YELLOW 08/27/2017 2225   APPEARANCEUR HAZY (A) 08/27/2017 2225   LABSPEC 1.020 08/27/2017 2225   PHURINE 6.0 08/27/2017 2225   GLUCOSEU NEGATIVE 08/27/2017 2225   HGBUR MODERATE (A) 08/27/2017 2225   BILIRUBINUR NEGATIVE 08/27/2017 2225   KETONESUR 5 (A) 08/27/2017 2225   PROTEINUR >=300 (A) 08/27/2017 2225   NITRITE NEGATIVE 08/27/2017 2225   LEUKOCYTESUR NEGATIVE 08/27/2017 2225   Sepsis Labs: @LABRCNTIP (procalcitonin:4,lacticidven:4)  ) Recent Results (from the past 240 hour(s))  Urine culture  Status: None   Collection Time: 08/27/17 10:25 PM  Result Value Ref Range Status   Specimen Description URINE, CATHETERIZED  Final   Special Requests NONE  Final   Culture NO GROWTH  Final   Report Status 08/29/2017 FINAL  Final      Studies: No results found.  Scheduled Meds: . ALPRAZolam  0.125 mg Oral BID   And  . ALPRAZolam  0.25 mg Oral QHS  . azithromycin  500 mg Oral Q24H  . rOPINIRole  1 mg Oral QHS  . senna-docusate  1 tablet Oral BID    Continuous Infusions: . cefTRIAXone (ROCEPHIN)  IV Stopped (08/31/17 0007)  . diltiazem (CARDIZEM) infusion 5 mg/hr (08/30/17 2212)  . heparin 1,300 Units/hr (08/30/17 2128)     LOS: 2 days     Darlin Droparole N Hall, MD [facility] Pager [phone]  If 7PM-7AM, please contact night-coverage [email or web address] Password [email or web address] 08/31/2017, 8:02 AM

## 2017-08-31 NOTE — Progress Notes (Signed)
Physical Therapy Treatment Patient Details Name: Patricia Payne MRN: [id number] DOB: 06/24/1923 Today's Date: 08/31/2017    History of Present Illness Pt is a 83 y.o. female who presented with altered mental status and weakness. Had been seen in ED day before admission for potential UTI but work-up was negative and pt was sent home where she became increasingly lethargic, confused, and having slurred speech. PMH significant for hypertension. CT negative and MRI pending.     PT Comments    Pt making slow, steady progress. Pt enjoyed sitting up in recliner.    Follow Up Recommendations  SNF     Equipment Recommendations  None recommended by PT    Recommendations for Other Services       Precautions / Restrictions Precautions Precautions: Fall Restrictions Weight Bearing Restrictions: No    Mobility  Bed Mobility Overal bed mobility: Needs Assistance Bed Mobility: Sit to Supine     Supine to sit: Max assist Sit to supine: +2 for physical assistance;Max assist   General bed mobility comments: Assist to lower trunk and bring legs up into the bed  Transfers Overall transfer level: Needs assistance Equipment used: Ambulation equipment used Transfers: Sit to/from Stand Sit to Stand: +2 physical assistance;Max assist         General transfer comment: Assist to bring hips and trunk up. Pt with lean to the rt. Used [employer]. Pivoted pt to the bed with Stedy.  Ambulation/Gait             General Gait Details: Unable   Stairs            Wheelchair Mobility    Modified Rankin (Stroke Patients Only)       Balance Overall balance assessment: Needs assistance Sitting-balance support: Feet supported;Bilateral upper extremity supported Sitting balance-Leahy Scale: Poor Sitting balance - Comments: Pt sat EOB x 2-3  minutes with min guard to min assist. Verbal cues to correct rt lateral lean. Postural control: Right lateral lean Standing balance support:  Bilateral upper extremity supported Standing balance-Leahy Scale: Zero Standing balance comment: +2 mod assist with stedy to maintain standing. Rt hip and knee remain flexed. Pt with lean to the rt.                            Cognition Arousal/Alertness: Awake/alert Behavior During Therapy: WFL for tasks assessed/performed Overall Cognitive Status: Impaired/Different from baseline Area of Impairment: Attention;Memory;Following commands;Safety/judgement;Awareness;Problem solving                   Current Attention Level: Sustained Memory: Decreased short-term memory Following Commands: Follows one step commands consistently;Follows one step commands with increased time Safety/Judgement: Decreased awareness of safety;Decreased awareness of deficits Awareness: Intellectual Problem Solving: Slow processing;Decreased initiation;Requires verbal cues General Comments: Pt conversing appropriately      Exercises      General Comments        Pertinent Vitals/Pain Pain Assessment: No/denies pain    Home Living                      Prior Function            PT Goals (current goals can now be found in the care plan section) Progress towards PT goals: Progressing toward goals    Frequency    Min 2X/week      PT Plan Current plan remains appropriate    Co-evaluation  AM-PAC PT "6 Clicks" Daily Activity  Outcome Measure  Difficulty turning over in bed (including adjusting bedclothes, sheets and blankets)?: Unable Difficulty moving from lying on back to sitting on the side of the bed? : Unable Difficulty sitting down on and standing up from a chair with arms (e.g., wheelchair, bedside commode, etc,.)?: Unable Help needed moving to and from a bed to chair (including a wheelchair)?: Total Help needed walking in hospital room?: Total Help needed climbing 3-5 steps with a railing? : Total 6 Click Score: 6    End of Session  Equipment Utilized During Treatment: Gait belt Activity Tolerance: Patient tolerated treatment well Patient left: with call bell/phone within reach;in bed;with nursing/sitter in room Nurse Communication: Mobility status;Need for lift equipment PT Visit Diagnosis: Other abnormalities of gait and mobility (R26.89);Muscle weakness (generalized) (M62.81);Difficulty in walking, not elsewhere classified (R26.2)     Time: [id number] PT Time Calculation (min) (ACUTE ONLY): 14 min  Charges:  $Therapeutic Activity: 8-22 mins                    G Codes:       [facility] Jahking Lesser PT [phone]    Angelina OkCary W [facility] 08/31/2017, 5:55 PM

## 2017-09-01 LAB — CBC
HEMATOCRIT: 31.3 % — AB (ref 36.0–46.0)
Hemoglobin: 10 g/dL — ABNORMAL LOW (ref 12.0–15.0)
MCH: 29.7 pg (ref 26.0–34.0)
MCHC: 31.9 g/dL (ref 30.0–36.0)
MCV: 92.9 fL (ref 78.0–100.0)
PLATELETS: 262 10*3/uL (ref 150–400)
RBC: 3.37 MIL/uL — AB (ref 3.87–5.11)
RDW: 13.9 % (ref 11.5–15.5)
WBC: 9 10*3/uL (ref 4.0–10.5)

## 2017-09-01 LAB — GLUCOSE, CAPILLARY
Glucose-Capillary: 117 mg/dL — ABNORMAL HIGH (ref 65–99)
Glucose-Capillary: 158 mg/dL — ABNORMAL HIGH (ref 65–99)

## 2017-09-01 LAB — BASIC METABOLIC PANEL
ANION GAP: 9 (ref 5–15)
BUN: 19 mg/dL (ref 6–20)
CHLORIDE: 105 mmol/L (ref 101–111)
CO2: 23 mmol/L (ref 22–32)
Calcium: 9 mg/dL (ref 8.9–10.3)
Creatinine, Ser: 0.71 mg/dL (ref 0.44–1.00)
GFR calc non Af Amer: >60 mL/min (ref >60)
Glucose, Bld: 120 mg/dL — ABNORMAL HIGH (ref 65–99)
POTASSIUM: 4.1 mmol/L (ref 3.5–5.1)
Sodium: 137 mmol/L (ref 135–145)

## 2017-09-01 LAB — HEPARIN LEVEL (UNFRACTIONATED)

## 2017-09-01 MED ORDER — LOSARTAN POTASSIUM 50 MG PO TABS
50.0000 mg | ORAL_TABLET | Freq: Every day | ORAL | Status: DC
  Administered 2017-09-02 – 2017-09-03 (×2): 50 mg via ORAL
  Filled 2017-09-01 (×2): qty 1

## 2017-09-01 MED ORDER — ATORVASTATIN CALCIUM 10 MG PO TABS
10.0000 mg | ORAL_TABLET | Freq: Every day | ORAL | Status: DC
  Administered 2017-09-01 – 2017-09-02 (×2): 10 mg via ORAL
  Filled 2017-09-01 (×2): qty 1

## 2017-09-01 MED ORDER — RIVAROXABAN 15 MG PO TABS
15.0000 mg | ORAL_TABLET | Freq: Every day | ORAL | Status: DC
  Administered 2017-09-01 – 2017-09-02 (×2): 15 mg via ORAL
  Filled 2017-09-01 (×2): qty 1

## 2017-09-01 NOTE — Clinical Social Work Note (Signed)
Clinical Social Work Assessment  Patient Details  Name: Patricia Payne MRN: [id number] Date of Birth: 08/07/1923  Date of referral:  09/01/17               Reason for consult:  Facility Placement                Permission sought to share information with:  Facility Industrial/product designerContact Representative Permission granted to share information::     Name::        Agency::  SNF  Relationship::  brother in Diplomatic Services operational officerlaw  Contact Information:     Housing/Transportation Living arrangements for the past 2 months:  Skilled Building surveyorursing Facility Source of Information:  Patient Patient Interpreter Needed:  None Criminal Activity/Legal Involvement Pertinent to Current Situation/Hospitalization:  No - Comment as needed Significant Relationships:  Siblings Lives with:  Self Do you feel safe going back to the place where you live?  No Need for family participation in patient care:  No (Coment)  Care giving concerns:  Pt lives at home alone and currently with increased mobility issues.  States sister and brother in law live nearby but they are also elderly and unable to provide much physical assistance.   Social Worker assessment / plan:  CSW spoke with pt and pt brother in law at bedside concerning PT recommendation for SNF.  CSW explained SNF and SNF referral process.  Employment status:  Retired Database administratornsurance information:  Managed Medicare PT Recommendations:  Skilled Nursing Facility Information / Referral to community resources:  Skilled Nursing Facility  Patient/Family's Response to care:  Pt is agreeable to SNF if that is what is being recommended.  Patient/Family's Understanding of and Emotional Response to Diagnosis, Current Treatment, and Prognosis:  Pt is realistic about her limited assistance at home and need for transitional facility to help her recovery her strength.  Emotional Assessment Appearance:  Appears stated age Attitude/Demeanor/Rapport:    Affect (typically observed):  Appropriate,  Pleasant Orientation:  Oriented to Self, Oriented to Place Alcohol / Substance use:  Not Applicable Psych involvement (Current and /or in the community):  No (Comment)  Discharge Needs  Concerns to be addressed:  Care Coordination Readmission within the last 30 days:  No Current discharge risk:  Physical Impairment, Lives alone Barriers to Discharge:  Continued Medical Work up, Designer, jewelleryAwaiting State Approval ([place])   Patricia SisUris, Patricia Payne H, LCSW 09/01/2017, 12:19 PM

## 2017-09-01 NOTE — Progress Notes (Signed)
PROGRESS NOTE  Patricia BreachMildred C Payne WUJ:[id number] DOB: 05/15/1923 DOA: 08/27/2017 PCP: Patient, No Pcp Per  HPI/Recap of past 24 hours: Ms. Patricia Payne is a 83 yo F with hx of HTN, HLD from home who lives alone presented with altered mental status on evening of 08/27/17. Suspected CAP, present on admission complicated by a-fib RvR and pulmonary edema. Had rapid response called yesterday morning 08/28/17 due to A-fib rvr with rate in the 160's cardizem drip and heparin drip initiated. On IV antibiotics for CAP.  No acute events reported overnight. Patient was seen and examined at her bedside. She has no complaints at this time. Denies chest pain, dyspnea or palpitations. Heart rate is well controlled on po cardizem. Converted back to sinus rhythm. 12 leads ekg ordered. Started on xarelto. Cardiology following and recommendations appreciated.   Assessment/Plan: Active Problems:   Acute encephalopathy   HTN (hypertension)   Atrial fibrillation with RVR (HCC)   Hypokalemia   Hypomagnesemia  1. Resolved Altered mental status -most likely 2/2 to sepsis, CAP, present on admission -continue IV ceftriaxone and IV azithromycin, day #5 -continue duonebs prn -continue O2 supplementation to maintain O2 sat 92% or greater -fall precaution -reorient as needed    2. Community acquired PNA, present on admission -management as stated above -swallow eval to r/o possible dysphagia regular thin liquid -ABG no sign of hypoxemia on 2L [place]  3. New onset Afib with RvR -diltiazem drip stopped 08/31/17 -po diltiazem 240 mg daily -rate controlled -heparin drip stopped 08/31/17 -xarelto 15 mg daily -telemetry  -CHADSVASC score 5 -echo 11/28 EF 55-60% -cardiology following -we appreciate cardiology, Dr. Jacinto HalimGanji, recommendations  4. Ambulatory dysfunction -PT/OT -PT recommends SNF   5. Hypertension  -losartan  -prn IV hydralazine for sbp >180 or dbp>105  6. HLD -simvastatin  7. Chronic normocytic  anemia -No sign of active bleeding- -hg stable 9.4 -baseline hg 10 -CBC am  8. Elevated troponin -most likely 2/2 to afib rvr -peaked at 0.4 (08/28/17) trending down to 0.17 -No ST T elevation. EKG personally reviewed. -2D echo done today 08/29/17 LVEF 55-60%  9. Anxiety -stable -xanax  10. Restless leg syndrome -ropinirole  11. chronic constipation -sennokot BID  12. Hypokalemia, resolved -K+ 3.8 from 3.0 from 2.8 -BMP am   Code Status: DNR  Family Communication: No family members at bedside.  Disposition Plan: Will stay another midnight to continue IV antibiotics. Possible SNF placement on Monday 09/03/17.   Consultants:  None   Procedures:  None  Antimicrobials:  IV ceftriaxone and IV azithromycin day # 5/5  DVT prophylaxis:  xarelto for new onset a-fib   Objective: Vitals:   09/01/17 0004 09/01/17 0400 09/01/17 0505 09/01/17 1200  BP: (!) 104/37 (!) 110/47  (!) 145/91  Pulse: 61 72  73  Resp: (!) 26 (!) 24  (!) 29  Temp: 98.3 F (36.8 C)   98.9 F (37.2 C)  TempSrc: Oral   Oral  SpO2: 100% 100%  96%  Weight:   62.1 kg (136 lb 14.5 oz)   Height:        Intake/Output Summary (Last 24 hours) at 09/01/2017 1540 Last data filed at 09/01/2017 0300 Gross per 24 hour  Intake 342.83 ml  Output 500 ml  Net -157.17 ml   Filed Weights   08/31/17 0400 08/31/17 0444 09/01/17 0505  Weight: 61.8 kg (136 lb 3.9 oz) 61.8 kg (136 lb 3.9 oz) 62.1 kg (136 lb 14.5 oz)    Exam:   General:  83 yo CF thin built laying in bed in NAD. Alert but confused  Cardiovascular: RRR with no rubs or gallops   Respiratory: Mild crackles at bases bilaterally. No wheezes noted  Abdomen: soft NT ND NBS x4 quadrants  Musculoskeletal: Moves all limbs. 2/4 pulses in all 4  Skin: No noted open lesions  Psychiatry: Mood is appropriate for condition and setting   Data Reviewed: CBC: Recent Labs  Lab 08/27/17 1800  08/28/17 0555 08/29/17 0341 08/30/17 0226  08/31/17 0242 09/01/17 0252  WBC 11.1*   < > 9.3 8.7 7.9 8.9 9.0  NEUTROABS 8.3*  --   --   --   --   --   --   HGB 11.6*   < > 10.7* 9.8* 9.7* 9.4* 10.0*  HCT 34.8*   < > 32.7* 30.1* 29.0* 29.2* 31.3*  MCV 91.6   < > 92.1 90.7 91.2 91.5 92.9  PLT 205   < > 174 193 217 234 262   < > = values in this interval not displayed.   Basic Metabolic Panel: Recent Labs  Lab 08/28/17 1003 08/28/17 1510 08/28/17 1648 08/29/17 0341 08/30/17 0226 08/31/17 0242 09/01/17 0252  NA  --  NOT DONE 138 138 140 137 137  K  --  NOT DONE 3.3* 2.8* 3.0* 3.8 4.1  CL  --  NOT DONE 106 106 105 106 105  CO2  --  NOT DONE 22 24 27 26 23   GLUCOSE  --  134* 195* 135* 127* 134* 120*  BUN  --  NOT DONE 24* 23* 16 18 19   CREATININE  --  0.93 0.92 0.74 0.73 0.76 0.71  CALCIUM  --  NOT DONE 8.4* 8.5* 8.4* 8.7* 9.0  MG 1.5* NOT DONE 1.8  --   --   --   --    GFR: Estimated Creatinine Clearance: 38.7 mL/min (by C-G formula based on SCr of 0.71 mg/dL). Liver Function Tests: Recent Labs  Lab 08/26/17 0501 08/27/17 1800 08/28/17 0555  AST 40 49* 43*  ALT 28 23 32  ALKPHOS 90 80 76  BILITOT 0.8 2.2* 0.5  PROT 7.7 6.1* 6.4*  ALBUMIN 3.9 2.6* 2.5*   No results for input(s): LIPASE, AMYLASE in the last 168 hours. Recent Labs  Lab 08/27/17 2149  AMMONIA 24   Coagulation Profile: Recent Labs  Lab 08/27/17 1800  INR 1.19   Cardiac Enzymes: Recent Labs  Lab 08/28/17 0555 08/28/17 1003 08/28/17 1510 08/29/17 0801  TROPONINI 0.06* 0.07* 0.42* 0.17*   BNP (last 3 results) No results for input(s): PROBNP in the last 8760 hours. HbA1C: No results for input(s): HGBA1C in the last 72 hours. CBG: Recent Labs  Lab 08/30/17 0119 08/31/17 0030 08/31/17 1059 08/31/17 1658 09/01/17 1106  GLUCAP 152* 156* 152* 135* 117*   Lipid Profile: No results for input(s): CHOL, HDL, LDLCALC, TRIG, CHOLHDL, LDLDIRECT in the last 72 hours. Thyroid Function Tests: No results for input(s): TSH, T4TOTAL, FREET4,  T3FREE, THYROIDAB in the last 72 hours. Anemia Panel: No results for input(s): VITAMINB12, FOLATE, FERRITIN, TIBC, IRON, RETICCTPCT in the last 72 hours. Urine analysis:    Component Value Date/Time   COLORURINE YELLOW 08/27/2017 2225   APPEARANCEUR HAZY (A) 08/27/2017 2225   LABSPEC 1.020 08/27/2017 2225   PHURINE 6.0 08/27/2017 2225   GLUCOSEU NEGATIVE 08/27/2017 2225   HGBUR MODERATE (A) 08/27/2017 2225   BILIRUBINUR NEGATIVE 08/27/2017 2225   KETONESUR 5 (A) 08/27/2017 2225   PROTEINUR >=300 (A) 08/27/2017  2225   NITRITE NEGATIVE 08/27/2017 2225   LEUKOCYTESUR NEGATIVE 08/27/2017 2225   Sepsis Labs: @LABRCNTIP (procalcitonin:4,lacticidven:4)  ) Recent Results (from the past 240 hour(s))  Urine culture     Status: None   Collection Time: 08/27/17 10:25 PM  Result Value Ref Range Status   Specimen Description URINE, CATHETERIZED  Final   Special Requests NONE  Final   Culture NO GROWTH  Final   Report Status 08/29/2017 FINAL  Final      Studies: No results found.  Scheduled Meds: . ALPRAZolam  0.125 mg Oral BID   And  . ALPRAZolam  0.25 mg Oral QHS  . atorvastatin  10 mg Oral q1800  . azithromycin  500 mg Oral Q24H  . diltiazem  240 mg Oral Daily  . [START ON 09/02/2017] losartan  50 mg Oral Daily  . rivaroxaban  15 mg Oral Q supper  . rOPINIRole  1 mg Oral QHS  . senna-docusate  1 tablet Oral BID    Continuous Infusions: . cefTRIAXone (ROCEPHIN)  IV Stopped (08/31/17 2342)     LOS: 3 days     Darlin Drop, MD [facility] Pager [phone]  If 7PM-7AM, please contact night-coverage [email or web address] Password [email or web address] 09/01/2017, 3:40 PM

## 2017-09-01 NOTE — Consult Note (Addendum)
Reason for Consult: A. Fib with RVR Referring Physician: Irene Pap, DO  Patricia Payne is an 83 y.o. female.  She lives independently, was driving her car until she was admitted to the hospital, patient recently evaluated at [facility] for generalized weakness, was discharged home however  presented back to the hospital with worsening confusion, failure to thrive, being treated for community-acquired pneumonia and sepsis.  However patient developed atrial fibrillation with rapid ventricular response after she was admitted to the hospital on 08/28/2017.  She has been on IV heparin, and started on IV diltiazem.  I was asked yesterday to see the patient as she is being discharged soon to rehab and the question was whether she should be on anticoagulation.  Patient does have a history of fall, but no recent fractures or recent admission to the hospital due to fall.  She also has chronic anemia.  Her past medical history significant for hypertension and hyperlipidemia.  Patient converted to sinus rhythm late last night, patient was unaware of any palpitations with A. fib.  I had recommended discontinuing diltiazem IV and switched her to p.o. diltiazem and also advised to start Xarelto 20 mg after dinner and discontinued IV heparin.  Patient is presently having lunch, denies any discomfort and states that except for generalized weakness she has no cough, dyspnea, palpitations.  Past Medical History:  Diagnosis Date  . Hypertension     Past Surgical History:  Procedure Laterality Date  . APPENDECTOMY      Family History  Problem Relation Age of Onset  . Hypertension Other     Social History:  reports that  has never smoked. she has never used smokeless tobacco. She reports that she does not drink alcohol or use drugs.  Allergies: No Known Allergies   Results for orders placed or performed during the hospital encounter of 08/27/17 (from the past 48 hour(s))  Glucose, capillary      Status: Abnormal   Collection Time: 08/31/17 12:30 AM  Result Value Ref Range   Glucose-Capillary 156 (H) 65 - 99 mg/dL  Heparin level (unfractionated)     Status: None   Collection Time: 08/31/17  2:42 AM  Result Value Ref Range   Heparin Unfractionated 0.53 0.30 - 0.70 IU/mL    Comment:        IF HEPARIN RESULTS ARE BELOW EXPECTED VALUES, AND PATIENT DOSAGE HAS BEEN CONFIRMED, SUGGEST FOLLOW UP TESTING OF ANTITHROMBIN III LEVELS.   CBC     Status: Abnormal   Collection Time: 08/31/17  2:42 AM  Result Value Ref Range   WBC 8.9 4.0 - 10.5 K/uL   RBC 3.19 (L) 3.87 - 5.11 MIL/uL   Hemoglobin 9.4 (L) 12.0 - 15.0 g/dL   HCT 29.2 (L) 36.0 - 46.0 %   MCV 91.5 78.0 - 100.0 fL   MCH 29.5 26.0 - 34.0 pg   MCHC 32.2 30.0 - 36.0 g/dL   RDW 13.8 11.5 - 15.5 %   Platelets 234 150 - 400 K/uL  Basic metabolic panel     Status: Abnormal   Collection Time: 08/31/17  2:42 AM  Result Value Ref Range   Sodium 137 135 - 145 mmol/L   Potassium 3.8 3.5 - 5.1 mmol/L    Comment: DELTA CHECK NOTED   Chloride 106 101 - 111 mmol/L   CO2 26 22 - 32 mmol/L   Glucose, Bld 134 (H) 65 - 99 mg/dL   BUN 18 6 - 20 mg/dL  Creatinine, Ser 0.76 0.44 - 1.00 mg/dL   Calcium 8.7 (L) 8.9 - 10.3 mg/dL   GFR calc non Af Amer >60 >60 mL/min   GFR calc Af Amer >60 >60 mL/min    Comment: (NOTE) The eGFR has been calculated using the CKD EPI equation. This calculation has not been validated in all clinical situations. eGFR's persistently <60 mL/min signify possible Chronic Kidney Disease.    Anion gap 5 5 - 15  Glucose, capillary     Status: Abnormal   Collection Time: 08/31/17 10:59 AM  Result Value Ref Range   Glucose-Capillary 152 (H) 65 - 99 mg/dL   Comment 1 Notify RN    Comment 2 Document in Chart   Glucose, capillary     Status: Abnormal   Collection Time: 08/31/17  4:58 PM  Result Value Ref Range   Glucose-Capillary 135 (H) 65 - 99 mg/dL  Heparin level (unfractionated)     Status: Abnormal    Collection Time: 09/01/17  2:52 AM  Result Value Ref Range   Heparin Unfractionated >2.20 (H) 0.30 - 0.70 IU/mL    Comment: RESULTS CONFIRMED BY MANUAL DILUTION        IF HEPARIN RESULTS ARE BELOW EXPECTED VALUES, AND PATIENT DOSAGE HAS BEEN CONFIRMED, SUGGEST FOLLOW UP TESTING OF ANTITHROMBIN III LEVELS.   CBC     Status: Abnormal   Collection Time: 09/01/17  2:52 AM  Result Value Ref Range   WBC 9.0 4.0 - 10.5 K/uL   RBC 3.37 (L) 3.87 - 5.11 MIL/uL   Hemoglobin 10.0 (L) 12.0 - 15.0 g/dL   HCT 31.3 (L) 36.0 - 46.0 %   MCV 92.9 78.0 - 100.0 fL   MCH 29.7 26.0 - 34.0 pg   MCHC 31.9 30.0 - 36.0 g/dL   RDW 13.9 11.5 - 15.5 %   Platelets 262 150 - 400 K/uL  Basic metabolic panel     Status: Abnormal   Collection Time: 09/01/17  2:52 AM  Result Value Ref Range   Sodium 137 135 - 145 mmol/L   Potassium 4.1 3.5 - 5.1 mmol/L   Chloride 105 101 - 111 mmol/L   CO2 23 22 - 32 mmol/L   Glucose, Bld 120 (H) 65 - 99 mg/dL   BUN 19 6 - 20 mg/dL   Creatinine, Ser 0.71 0.44 - 1.00 mg/dL   Calcium 9.0 8.9 - 10.3 mg/dL   GFR calc non Af Amer >60 >60 mL/min   GFR calc Af Amer >60 >60 mL/min    Comment: (NOTE) The eGFR has been calculated using the CKD EPI equation. This calculation has not been validated in all clinical situations. eGFR's persistently <60 mL/min signify possible Chronic Kidney Disease.    Anion gap 9 5 - 15  Glucose, capillary     Status: Abnormal   Collection Time: 09/01/17 11:06 AM  Result Value Ref Range   Glucose-Capillary 117 (H) 65 - 99 mg/dL   Comment 1 Notify RN    Comment 2 Document in Chart     No results found.  Review of Systems  Constitutional: Positive for malaise/fatigue. Negative for chills, diaphoresis, fever and weight loss.  HENT: Negative for ear pain, hearing loss and tinnitus.   Eyes: Negative for blurred vision and double vision.  Respiratory: Negative for cough, hemoptysis, sputum production, shortness of breath and wheezing.    Cardiovascular: Negative for chest pain, palpitations, orthopnea, claudication, leg swelling and PND.  Gastrointestinal: Negative.   Genitourinary: Negative.   Musculoskeletal:  Positive for falls.  Skin: Negative for itching and rash.  Neurological: Positive for dizziness and weakness. Negative for tingling, tremors, speech change, focal weakness, loss of consciousness and headaches.  Endo/Heme/Allergies: Does not bruise/bleed easily.  Psychiatric/Behavioral: Negative for depression and suicidal ideas.  All other systems reviewed and are negative.  Blood pressure (!) 145/91, pulse 73, temperature 98.9 F (37.2 C), temperature source Oral, resp. rate (!) 29, height 5' 4"  (1.626 m), weight 62.1 kg (136 lb 14.5 oz), SpO2 96 %. Physical Exam  Constitutional: She is oriented to person, place, and time.  Frail elderly Caucasian female who appears to be in no acute distress.  HENT:  Head: Atraumatic.  Eyes: Conjunctivae are normal.  Neck: Normal range of motion. Neck supple.  Cardiovascular: Normal rate. Exam reveals no gallop.  Murmur (There is a systolic ejection murmur in the right sternal border and apex.  2/6 in intensity.) heard. She has prominent right carotid bruit, bilateral femoral pulse normal, pedal pulses were faint bilaterally.  No evidence of acute arterial insufficiency.  Respiratory: Effort normal.  Diminished breath sounds at the bases, clear lung fields.  GI: Soft. Bowel sounds are normal.  Musculoskeletal: She exhibits no edema, tenderness or deformity.  Neurological: She is alert and oriented to person, place, and time.  Skin: Skin is warm and dry.   EKG 08/30/2017: Atrial fibrillation with rapid ventricular response controlled ventricular response at rate of 88 bpm, left bundle branch block.  T.  Echocardiogram 08/29/2017: Normal LV systolic function, moderate LVH, mild aortic stenosis, aortic valve area 1.3 cm, mean gradient 11 mmHg.  Severe mitral annular  calcification.  Moderate left atrial enlargement.  Mild TR, moderate pulmonary hypertension, PA pressure 48 mmHg.  Carotid duplex 11/30/2015: Bilateral hemodynamically significant ICA stenosis.  I reviewed the data she appears to have less than 50% stenosis on the right and left.  Left common carotid appears to have moderate intimal hyperplasia.  Scheduled Meds: . ALPRAZolam  0.125 mg Oral BID   And  . ALPRAZolam  0.25 mg Oral QHS  . azithromycin  500 mg Oral Q24H  . diltiazem  240 mg Oral Daily  . losartan  25 mg Oral Daily  . rivaroxaban  15 mg Oral Q supper  . rOPINIRole  1 mg Oral QHS  . senna-docusate  1 tablet Oral BID  . simvastatin  20 mg Oral q1800   Continuous Infusions: . cefTRIAXone (ROCEPHIN)  IV Stopped (08/31/17 2342)   PRN Meds:.acetaminophen **OR** acetaminophen (TYLENOL) oral liquid 160 mg/5 mL **OR** acetaminophen, alum & mag hydroxide-simeth   Assessment/Plan: 1.  Paroxysmal atrial fibrillation with controlled ventricular response, back in sinus rhythm today on 09/01/2017. CHA2DS2-VASCScore: Risk Score  5,  Yearly risk of stroke  6.7. Recommendation: ASA No/Anticoagulation Yes 2.  Hypertension 3.  Hyperlipidemia 4.  Bilateral mild carotid artery stenosis  Recommendation: Patient has converted back to sinus rhythm.  Blood pressure is elevated today, would recommend increasing losartan to 50 mg daily.  The Xarelto dosage appears to be suboptimal as there is no recommendation with regard to age or gender, her EGFR is greater than 60 mL.  However diltiazem does increase the level of Xarelto, hence could potentially continue with 15 mg in the evening.  She lived independently, until recent hospitalization.  In view of very high cardioembolic risk, I have recommended that she continue Xarelto.  Anemia is chronic, there is no history of significant fall that needed hospitalization in the past, however risk is real with regard  to cardioembolic phenomena.  She will need close  monitoring of her CBC.  I will be happy to follow her in the outpatient basis once discharged from the hospital.  Thank you for asking me to see this pleasant patient.  She does have bilateral carotid stenosis which appears to be mild by duplex in 2017, given her age and comorbidities, would not recommend any further testing.  Adrian Prows, MD 09/01/2017, 3:01 PM [facility] [place] Pager: [phone] Office: [phone] If no answer: Cell:  [phone]

## 2017-09-01 NOTE — NC FL2 (Signed)
[place] MEDICAID FL2 LEVEL OF CARE SCREENING TOOL     IDENTIFICATION  Patient Name: Patricia Payne Birthdate: 02/10/1923 Sex: female Admission Date (Current Location): 08/27/2017  [facility] [place]   Facility and Address:  [facility], [address], [place], [place] [address]      Provider Number: [id number]  Attending Physician Name and Address:  Darlin DropHall, Carole N, DO  Relative Name and Phone Number:       Current Level of Care: Hospital Recommended Level of Care: Skilled Nursing Facility Prior Approval Number:    Date Approved/Denied:   PASRR Number:    Discharge Plan: SNF    Current Diagnoses: Patient Active Problem List   Diagnosis Date Noted  . Atrial fibrillation with RVR (HCC) 08/28/2017  . Hypokalemia 08/28/2017  . Hypomagnesemia 08/28/2017  . Acute encephalopathy 08/27/2017  . HTN (hypertension) 08/27/2017    Orientation RESPIRATION BLADDER Height & Weight     Self, Place  O2(2L [place]) Incontinent Weight: 136 lb 14.5 oz (62.1 kg) Height:  5\' 4"  (162.6 cm)  BEHAVIORAL SYMPTOMS/MOOD NEUROLOGICAL BOWEL NUTRITION STATUS      Incontinent Diet(regular)  AMBULATORY STATUS COMMUNICATION OF NEEDS Skin   Extensive Assist Verbally Normal                       Personal Care Assistance Level of Assistance  Bathing, Dressing Bathing Assistance: Maximum assistance   Dressing Assistance: Maximum assistance     Functional Limitations Info             SPECIAL CARE FACTORS FREQUENCY  PT (By licensed PT), OT (By licensed OT)     PT Frequency: 5/wk OT Frequency: 5/wk            Contractures      Additional Factors Info  Code Status, Allergies, Psychotropic Code Status Info: DNR Allergies Info: NKA Psychotropic Info: xanax         Current Medications (09/01/2017):  This is the current hospital active medication list Current Facility-Administered Medications  Medication Dose Route Frequency  Provider Last Rate Last Dose  . acetaminophen (TYLENOL) tablet 650 mg  650 mg Oral Q4H PRN Eduard ClosKakrakandy, Arshad N, MD       Or  . acetaminophen (TYLENOL) solution 650 mg  650 mg Per Tube Q4H PRN Eduard ClosKakrakandy, Arshad N, MD       Or  . acetaminophen (TYLENOL) suppository 650 mg  650 mg Rectal Q4H PRN Eduard ClosKakrakandy, Arshad N, MD      . ALPRAZolam Prudy Feeler(XANAX) tablet 0.125 mg  0.125 mg Oral BID Vann, Jessica U, DO   0.125 mg at 08/31/17 1400   And  . ALPRAZolam Prudy Feeler(XANAX) tablet 0.25 mg  0.25 mg Oral QHS Vann, Jessica U, DO   0.25 mg at 08/31/17 2251  . alum & mag hydroxide-simeth (MAALOX/MYLANTA) 200-200-20 MG/5ML suspension 15 mL  15 mL Oral Q6H PRN Eduard ClosKakrakandy, Arshad N, MD   15 mL at 08/31/17 1917  . azithromycin (ZITHROMAX) tablet 500 mg  500 mg Oral Q24H Dow AdolphHall, Carole N, DO   500 mg at 08/31/17 2251  . cefTRIAXone (ROCEPHIN) 1 g in dextrose 5 % 50 mL IVPB  1 g Intravenous Q24H Eduard ClosKakrakandy, Arshad N, MD   Stopped at 08/31/17 2342  . diltiazem (CARDIZEM CD) 24 hr capsule 240 mg  240 mg Oral Daily Dow AdolphHall, Carole N, DO   240 mg at 08/31/17 1744  . losartan (COZAAR) tablet 25 mg  25 mg Oral Daily Dow AdolphHall, Carole N, DO   25 mg at 08/31/17 2250  . Rivaroxaban (XARELTO) tablet 15 mg  15 mg Oral Q supper Hall, Carole N, DO      . rOPINIRole (REQUIP) tablet 1 mg  1 mg Oral QHS Vann, Jessica U, DO   1 mg at 08/31/17 2250  . senna-docusate (Senokot-S) tablet 1 tablet  1 tablet Oral BID Darlin DropHall, Carole N, DO   1 tablet at 08/30/17 2203  . simvastatin (ZOCOR) tablet 20 mg  20 mg Oral q1800 Darlin DropHall, Carole N, DO         Discharge Medications: Please see discharge summary for a list of discharge medications.  Relevant Imaging Results:  Relevant Lab Results:   Additional Information SS#: [id number]  Burna SisUris, Dontaye Hur H, LCSW

## 2017-09-02 LAB — GLUCOSE, CAPILLARY
GLUCOSE-CAPILLARY: 107 mg/dL — AB (ref 65–99)
Glucose-Capillary: 107 mg/dL — ABNORMAL HIGH (ref 65–99)
Glucose-Capillary: 123 mg/dL — ABNORMAL HIGH (ref 65–99)

## 2017-09-02 NOTE — Discharge Instructions (Signed)

## 2017-09-02 NOTE — Plan of Care (Signed)
  Pain Managment: General experience of comfort will improve 09/01/2017 2200 - Progressing by Ruel FavorsBrown-Staples, Krishawna Stiefel, RN   Safety: Ability to remain free from injury will improve 09/01/2017 2200 - Progressing by Ruel FavorsBrown-Staples, Rahmir Beever, RN   Skin Integrity: Risk for impaired skin integrity will decrease 09/01/2017 2200 - Progressing by Ruel FavorsBrown-Staples, Kima Malenfant, RN   Health Behavior/Discharge Planning: Ability to manage health-related needs will improve 09/02/2017 0412 - Progressing by Ruel FavorsBrown-Staples, Ihan Pat, RN   Self-Care: Ability to participate in self-care as condition permits will improve 09/02/2017 0412 - Progressing by Ruel FavorsBrown-Staples, Humza Tallerico, RN Verbalization of feelings and concerns over difficulty with self-care will improve 09/02/2017 0412 - Progressing by Ruel FavorsBrown-Staples, Lundynn Cohoon, RN

## 2017-09-02 NOTE — Progress Notes (Signed)
Patricia Payne:[id number] DOB: 15-May-1923 DOA: 08/27/2017 PCP: Patient, No Pcp Per  HPI/Recap of past 24 hours: Patricia Payne is a 83 yo F with hx of HTN, HLD from home who lives alone presented with altered mental status on evening of 08/27/17. Suspected CAP, present on admission complicated by a-fib RvR and pulmonary edema. Had rapid response called yesterday morning 08/28/17 due to A-fib rvr with rate in the 160's cardizem drip and heparin drip initiated. On IV antibiotics for CAP.  No acute events reported overnight. Patient was seen and examined at her bedside. She has no complaints at this time. Denies chest pain, dyspnea or palpitations. Heart rate is well controlled on po cardizem. Converted back to sinus rhythm. 12 leads ekg to confirm. on xarelto for DVT ppx. Cardiology following and recommendations appreciated.  Patient seen and examined with no family members at her bedside. She has no complaints. She is alert and oriented x 3. Denies chest pain or dyspnea.   Assessment/Plan: Active Problems:   Acute encephalopathy   HTN (hypertension)   Atrial fibrillation with RVR (HCC)   Hypokalemia   Hypomagnesemia  1. Resolved Altered mental status -most likely 2/2 to sepsis, CAP, present on admission -continue IV ceftriaxone and IV azithromycin, day #6/6 -continue duonebs prn -continue O2 supplementation to maintain O2 sat 92% or greater -fall precaution -reorient as needed    2. Community acquired PNA, present on admission -management as stated above -swallow eval -regular thin liquid -ABG no sign of hypoxemia on 2L [place]  3. New onset Afib with RvR -diltiazem drip stopped 08/31/17 -po diltiazem 240 mg daily -rate controlled -heparin drip stopped 08/31/17 -xarelto 15 mg daily -telemetry  -CHADSVASC score 5 -echo 11/28 EF 55-60% -cardiology following -we appreciate cardiology, Dr. Jacinto Halim, recommendations  4. Ambulatory dysfunction -PT/OT -PT  recommends SNF   5. Hypertension  -stable -losartan  -prn IV hydralazine for sbp >180 or dbp>105  6. HLD -simvastatin  7. Chronic normocytic anemia -No sign of active bleeding- -hg stable 9.4 -baseline hg 10 -CBC am  8. Elevated troponin -most likely 2/2 to afib rvr -peaked at 0.4 (08/28/17) trending down to 0.17 -No ST T elevation. EKG personally reviewed. -2D echo done today 08/29/17 LVEF 55-60%  9. Anxiety -stable -xanax  10. Restless leg syndrome -ropinirole  11. chronic constipation -sennokot BID  12. Hypokalemia, resolved -K+ 3.8 from 3.0 from 2.8 -BMP am   Code Status: DNR  Family Communication: No family members at bedside.  Disposition Plan: Will stay another midnight to continue IV antibiotics. Possible SNF placement on Monday 09/03/17.   Consultants:  None   Procedures:  None  Antimicrobials:  IV ceftriaxone and IV azithromycin day # 6/6  DVT prophylaxis:  xarelto for new onset a-fib   Objective: Vitals:   09/01/17 1600 09/01/17 2000 09/02/17 0000 09/02/17 0350  BP: (!) 143/59 (!) 137/52 (!) 151/57 (!) 148/48  Pulse: 81 86 83 75  Resp: (!) 21 (!) 31 (!) 25 (!) 30  Temp:  98.5 F (36.9 C) 98.7 F (37.1 C) 97.9 F (36.6 C)  TempSrc:  Oral Oral Oral  SpO2: 94% 90% 97% 96%  Weight:    63.8 kg (140 lb 10.5 oz)  Height:        Intake/Output Summary (Last 24 hours) at 09/02/2017 0807 Last data filed at 09/01/2017 1215 Gross per 24 hour  Intake 600 ml  Output -  Net 600 ml   Filed Weights   08/31/17 0444  09/01/17 0505 09/02/17 0350  Weight: 61.8 kg (136 lb 3.9 oz) 62.1 kg (136 lb 14.5 oz) 63.8 kg (140 lb 10.5 oz)    Exam:   General:  83 yo CF thin built laying in bed in NAD. Alert and oriented x 3  Cardiovascular: RRR with no rubs or gallops   Respiratory: Mild crackles at bases bilaterally. No wheezes noted  Abdomen: soft NT ND NBS x4 quadrants  Musculoskeletal: Moves all limbs. 2/4 pulses in all 4  Skin: No noted  open lesions  Psychiatry: Mood is appropriate for condition and setting   Data Reviewed: CBC: Recent Labs  Lab 08/27/17 1800  08/28/17 0555 08/29/17 0341 08/30/17 0226 08/31/17 0242 09/01/17 0252  WBC 11.1*   < > 9.3 8.7 7.9 8.9 9.0  NEUTROABS 8.3*  --   --   --   --   --   --   HGB 11.6*   < > 10.7* 9.8* 9.7* 9.4* 10.0*  HCT 34.8*   < > 32.7* 30.1* 29.0* 29.2* 31.3*  MCV 91.6   < > 92.1 90.7 91.2 91.5 92.9  PLT 205   < > 174 193 217 234 262   < > = values in this interval not displayed.   Basic Metabolic Panel: Recent Labs  Lab 08/28/17 1003 08/28/17 1510 08/28/17 1648 08/29/17 0341 08/30/17 0226 08/31/17 0242 09/01/17 0252  NA  --  NOT DONE 138 138 140 137 137  K  --  NOT DONE 3.3* 2.8* 3.0* 3.8 4.1  CL  --  NOT DONE 106 106 105 106 105  CO2  --  NOT DONE 22 24 27 26 23   GLUCOSE  --  134* 195* 135* 127* 134* 120*  BUN  --  NOT DONE 24* 23* 16 18 19   CREATININE  --  0.93 0.92 0.74 0.73 0.76 0.71  CALCIUM  --  NOT DONE 8.4* 8.5* 8.4* 8.7* 9.0  MG 1.5* NOT DONE 1.8  --   --   --   --    GFR: Estimated Creatinine Clearance: 38.7 mL/min (by C-G formula based on SCr of 0.71 mg/dL). Liver Function Tests: Recent Labs  Lab 08/27/17 1800 08/28/17 0555  AST 49* 43*  ALT 23 32  ALKPHOS 80 76  BILITOT 2.2* 0.5  PROT 6.1* 6.4*  ALBUMIN 2.6* 2.5*   No results for input(s): LIPASE, AMYLASE in the last 168 hours. Recent Labs  Lab 08/27/17 2149  AMMONIA 24   Coagulation Profile: Recent Labs  Lab 08/27/17 1800  INR 1.19   Cardiac Enzymes: Recent Labs  Lab 08/28/17 0555 08/28/17 1003 08/28/17 1510 08/29/17 0801  TROPONINI 0.06* 0.07* 0.42* 0.17*   BNP (last 3 results) No results for input(s): PROBNP in the last 8760 hours. HbA1C: No results for input(s): HGBA1C in the last 72 hours. CBG: Recent Labs  Lab 08/31/17 1658 09/01/17 1106 09/01/17 2114 09/02/17 0044 09/02/17 0623  GLUCAP 135* 117* 158* 123* 107*   Lipid Profile: No results for  input(s): CHOL, HDL, LDLCALC, TRIG, CHOLHDL, LDLDIRECT in the last 72 hours. Thyroid Function Tests: No results for input(s): TSH, T4TOTAL, FREET4, T3FREE, THYROIDAB in the last 72 hours. Anemia Panel: No results for input(s): VITAMINB12, FOLATE, FERRITIN, TIBC, IRON, RETICCTPCT in the last 72 hours. Urine analysis:    Component Value Date/Time   COLORURINE YELLOW 08/27/2017 2225   APPEARANCEUR HAZY (A) 08/27/2017 2225   LABSPEC 1.020 08/27/2017 2225   PHURINE 6.0 08/27/2017 2225   GLUCOSEU NEGATIVE 08/27/2017 2225  HGBUR MODERATE (A) 08/27/2017 2225   BILIRUBINUR NEGATIVE 08/27/2017 2225   KETONESUR 5 (A) 08/27/2017 2225   PROTEINUR >=300 (A) 08/27/2017 2225   NITRITE NEGATIVE 08/27/2017 2225   LEUKOCYTESUR NEGATIVE 08/27/2017 2225   Sepsis Labs: @LABRCNTIP (procalcitonin:4,lacticidven:4)  ) Recent Results (from the past 240 hour(s))  Urine culture     Status: None   Collection Time: 08/27/17 10:25 PM  Result Value Ref Range Status   Specimen Description URINE, CATHETERIZED  Final   Special Requests NONE  Final   Culture NO GROWTH  Final   Report Status 08/29/2017 FINAL  Final      Studies: No results found.  Scheduled Meds: . ALPRAZolam  0.125 mg Oral BID   And  . ALPRAZolam  0.25 mg Oral QHS  . atorvastatin  10 mg Oral q1800  . azithromycin  500 mg Oral Q24H  . diltiazem  240 mg Oral Daily  . losartan  50 mg Oral Daily  . rivaroxaban  15 mg Oral Q supper  . rOPINIRole  1 mg Oral QHS  . senna-docusate  1 tablet Oral BID    Continuous Infusions: . cefTRIAXone (ROCEPHIN)  IV Stopped (09/01/17 2100)     LOS: 4 days     Darlin Droparole N Laurell Coalson, MD [facility] Pager [phone]  If 7PM-7AM, please contact night-coverage [email or web address] Password [email or web address] 09/02/2017, 8:07 AM

## 2017-09-02 NOTE — Plan of Care (Signed)
  Clinical Measurements: Will remain free from infection 09/02/2017 2108 - Progressing by Ruel FavorsBrown-Staples, Arelene Moroni, RN   Activity: Risk for activity intolerance will decrease 09/02/2017 2108 - Progressing by Ruel FavorsBrown-Staples, Edla Para, RN   Pain Managment: General experience of comfort will improve 09/02/2017 2108 - Progressing by Ruel FavorsBrown-Staples, Margia Wiesen, RN   Skin Integrity: Risk for impaired skin integrity will decrease 09/02/2017 2108 - Progressing by Ruel FavorsBrown-Staples, Sangeeta Youse, RN   Health Behavior/Discharge Planning: Ability to manage health-related needs will improve 09/02/2017 2108 - Progressing by Ruel FavorsBrown-Staples, Josejuan Hoaglin, RN

## 2017-09-03 LAB — CBC
HCT: 29.6 % — ABNORMAL LOW (ref 36.0–46.0)
Hemoglobin: 9.4 g/dL — ABNORMAL LOW (ref 12.0–15.0)
MCH: 29.6 pg (ref 26.0–34.0)
MCHC: 31.8 g/dL (ref 30.0–36.0)
MCV: 93.1 fL (ref 78.0–100.0)
PLATELETS: 350 10*3/uL (ref 150–400)
RBC: 3.18 MIL/uL — AB (ref 3.87–5.11)
RDW: 14.3 % (ref 11.5–15.5)
WBC: 8.3 10*3/uL (ref 4.0–10.5)

## 2017-09-03 LAB — BASIC METABOLIC PANEL
ANION GAP: 7 (ref 5–15)
BUN: 19 mg/dL (ref 6–20)
CALCIUM: 9.1 mg/dL (ref 8.9–10.3)
CO2: 26 mmol/L (ref 22–32)
Chloride: 107 mmol/L (ref 101–111)
Creatinine, Ser: 0.75 mg/dL (ref 0.44–1.00)
GLUCOSE: 135 mg/dL — AB (ref 65–99)
POTASSIUM: 3.8 mmol/L (ref 3.5–5.1)
SODIUM: 140 mmol/L (ref 135–145)

## 2017-09-03 NOTE — Care Management Note (Signed)
Case Management Note  Patient Details  Name: Ernst BreachMildred C Sweetin MRN: [id number] Date of Birth: 04/08/1923  Subjective/Objective:     Pt admitted with altered mental status. Questionable pneumonia versus CHF             [facility]Plan[facility].  No PCP noted.  CM will continue to follow for transitioning to SNF.  CSW following for placement needs.  Expected Discharge Date:                  Expected Discharge Plan:  Skilled Nursing Facility  In-House Referral:  Clinical Social Work  Discharge planning Services  CM Consult  Post Acute Care Choice:    Choice offered to:     DME Arranged:    DME Agency:     HH Arranged:    HH Agency:     Status of Service:  In process, will continue to follow  If discussed at Long Length of Stay Meetings, dates discussed:    Additional Comments:  Yancey FlemingsKimberly R Becton, RN 09/03/2017, 12:22 PM

## 2017-09-03 NOTE — Progress Notes (Signed)
Occupational Therapy Treatment Patient Details Name: Patricia BreachMildred C Payne MRN: [id number] DOB: 06/29/1923 Today's Date: 09/03/2017    History of present illness Pt is a 83 y.o. female who presented with altered mental status and weakness. Had been seen in ED day before admission for potential UTI but work-up was negative and pt was sent home where she became increasingly lethargic, confused, and having slurred speech. PMH significant for hypertension. CT negative and MRI pending.    OT comments  Pt immediately willing to work with OT. Max assist for bed mobility. She was able to sit EOB x 15 minutes with supervision and engage in simple grooming activities. She stood x 2 with walker from elevated bed with +2 max assist. Pt grateful for OT's visit at end of session. Continues to be appropriate for SNF level therapies.  Follow Up Recommendations  SNF;Supervision/Assistance - 24 hour    Equipment Recommendations       Recommendations for Other Services      Precautions / Restrictions Precautions Precautions: Fall Restrictions Weight Bearing Restrictions: No       Mobility Bed Mobility Overal bed mobility: Needs Assistance Bed Mobility: Supine to Sit;Sit to Supine     Supine to sit: Max assist Sit to supine: Max assist   General bed mobility comments: assist for all aspects  Transfers Overall transfer level: Needs assistance Equipment used: Rolling walker (2 wheeled) Transfers: Sit to/from Stand Sit to Stand: +2 physical assistance;Max assist         General transfer comment: stood x 2 from elevated EOB with assist to rise and shift weight anterior, pt not able to take steps [facility]    Balance Overall balance assessment: Needs assistance   Sitting balance-Leahy Scale: Fair Sitting balance - Comments: 15 min with supervision                                   ADL either performed or assessed with clinical judgement   ADL Overall ADL's : Needs  assistance/impaired     Grooming: Wash/dry hands;Wash/dry face;Minimal assistance;Brushing hair;Maximal assistance;Sitting           Upper Body Dressing : Maximal assistance;Sitting   Lower Body Dressing: Total assistance;Bed level                 General ADL Comments: Pt tolerated sitting EOB x 15 minutes with supervision for safety.     Vision       Perception     Praxis      Cognition Arousal/Alertness: Awake/alert Behavior During Therapy: WFL for tasks assessed/performed Overall Cognitive Status: Impaired/Different from baseline Area of Impairment: Memory;Following commands;Problem solving;Safety/judgement                     Memory: Decreased short-term memory Following Commands: Follows one step commands with increased time     Problem Solving: Slow processing;Decreased initiation;Requires verbal cues General Comments: Pt aware she needs rehab prior to return home.        Exercises     Shoulder Instructions       General Comments      Pertinent Vitals/ Pain       Pain Assessment: No/denies pain  Home Living  Prior Functioning/Environment              Frequency  Min 2X/week        Progress Toward Goals  OT Goals(current goals can now be found in the care plan section)  Progress towards OT goals: Progressing toward goals  Acute Rehab OT Goals Patient Stated Goal: I hope I can walk OT Goal Formulation: With patient/family Time For Goal Achievement: 09/12/17 Potential to Achieve Goals: Good  Plan Discharge plan remains appropriate    Co-evaluation                 AM-PAC PT "6 Clicks" Daily Activity     Outcome Measure   Help from another person eating meals?: A Little Help from another person taking care of personal grooming?: A Lot Help from another person toileting, which includes using toliet, bedpan, or urinal?: Total Help from another person  bathing (including washing, rinsing, drying)?: Total Help from another person to put on and taking off regular upper body clothing?: A Lot Help from another person to put on and taking off regular lower body clothing?: Total 6 Click Score: 10    End of Session Equipment Utilized During Treatment: Gait belt;Rolling walker  OT Visit Diagnosis: Muscle weakness (generalized) (M62.81);Other abnormalities of gait and mobility (R26.89);Other symptoms and signs involving cognitive function   Activity Tolerance Patient tolerated treatment well   Patient Left in bed;with call bell/phone within reach;with bed alarm set   Nurse Communication          Time: [id number] OT Time Calculation (min): 31 min  Charges: OT General Charges $OT Visit: 1 Visit OT Treatments $Self Care/Home Management : 8-22 mins $Therapeutic Activity: 8-22 mins  09/03/2017 Martie RoundJulie Najee Manninen, OTR/L Pager: [phone]   Iran PlanasMayberry, [place] BailiffJulie Lynn 09/03/2017, 2:14 PM

## 2017-09-03 NOTE — Progress Notes (Addendum)
[facility] Anticipated discharge date: 12/3 Family notified: pt sister  Transportation by [facility]- called at 2:15pm Report #: [phone]  Ext [phone]  CSW signing off.  Burna SisJenna H. Laelle Bridgett, LCSW Clinical Social Worker [phone]

## 2017-09-03 NOTE — Clinical Social Work Placement (Signed)
   CLINICAL SOCIAL WORK PLACEMENT  NOTE  Date:  09/03/2017  Patient Details  Name: Patricia Payne MRN: [id number] Date of Birth: 05/04/1923  Clinical Social Work is seeking post-discharge placement for this patient at the Skilled  Nursing Facility level of care (*CSW will initial, date and re-position this form in  chart as items are completed):  Yes   Patient/family provided with [facility] Clinical Social Work Department's list of facilities offering this level of care within the geographic area requested by the patient (or if unable, by the patient's family).  Yes   Patient/family informed of their freedom to choose among providers that offer the needed level of care, that participate in Medicare, Medicaid or managed care program needed by the patient, have an available bed and are willing to accept the patient.  Yes   Patient/family informed of [facility]'s ownership interest in [facility] and [facility], as well as of the fact that they are under no obligation to receive care at these facilities.  PASRR submitted to EDS on 09/03/17     PASRR number received on 09/03/17     Existing PASRR number confirmed on       FL2 transmitted to all facilities in geographic area requested by pt/family on 09/01/17     FL2 transmitted to all facilities within larger geographic area on       Patient informed that his/her managed care company has contracts with or will negotiate with certain facilities, including the following:        Yes   Patient/family informed of bed offers received.  [facility]     Physician recommends and patient chooses bed at      [facility]  Patient to be transferred to facility by ptar     Patient family notified on 09/03/17 of transfer.  Name of family member notified:  Gracie     PHYSICIAN Please sign FL2, Please sign DNR     Additional Comment:     _______________________________________________ Burna SisUris, Imani Sherrin H, LCSW 09/03/2017, 2:14 PM

## 2017-09-03 NOTE — Care Management Important Message (Signed)
Important Message  Patient Details  Name: Patricia BreachMildred C Payne MRN: [id number] Date of Birth: 11/23/1922   Medicare Important Message Given:  Yes    Kyla BalzarineShealy, Glenys Snader Abena 09/03/2017, 10:44 AM

## 2017-09-03 NOTE — Care Management Note (Signed)
Case Management Note  Patient Details  Name: Patricia Payne MRN: [id number] Date of Birth: 11/03/1922  Subjective/Objective:     Pt admitted with altered mental status. Questionable pneumonia versus CHF             Action/Plan: [facility].  No PCP noted.  CM will continue to follow for transitioning to SNF.  CSW following for placement needs.  Expected Discharge Date:  09/03/17               Expected Discharge Plan:  Skilled Nursing Facility  In-House Referral:  Clinical Social Work  Discharge planning Services  CM Consult  Post Acute Care Choice:  NA Choice offered to:  NA  DME Arranged:    DME Agency:     HH Arranged:    HH Agency:     Status of Service:  Completed, signed off  If discussed at [employer] of [employer] Meetings, dates discussed:    Discharge Disposition: skilled facility   Additional Comments:   09/03/17- 1450- Donn PieriniKristi Shaun Zuccaro RN CM - pt for d/c to SNF today- CSW following for placement  Darrold SpanWebster, Srinika Delone Hall, RN 09/03/2017, 2:49 PM

## 2017-09-03 NOTE — Discharge Summary (Addendum)
Discharge Summary  Ernst BreachMildred C Dyar ZOX:[id number] DOB: 01/18/1923  PCP: Patient, No Pcp Per  Admit date: 08/27/2017 Discharge date: 09/03/2017  Time spent: 25 minutes  Recommendations for Outpatient Follow-up:  1. Follow up with PCP within a week 2. Take your medications as prescribed 3. Continue PT at SNF 4. Fall precaution  Discharge Diagnoses:  Active Hospital Problems   Diagnosis Date Noted  . Atrial fibrillation with RVR (HCC) 08/28/2017  . Hypokalemia 08/28/2017  . Hypomagnesemia 08/28/2017  . Acute encephalopathy 08/27/2017  . HTN (hypertension) 08/27/2017    Resolved Hospital Problems  No resolved problems to display.    Discharge Condition: stable  Diet recommendation: resume previous diet  Vitals:   09/03/17 0800 09/03/17 1200  BP: 133/69 138/73  Pulse:    Resp: (!) 23 (!) 21  Temp: 97.9 F (36.6 C)   SpO2: 94% 95%    History of present illness:  [name] with hx of HTN, HLD from home who lives alone presented with altered mental status on evening of 08/27/17. Suspected CAP, present on admission complicated by a-fib RvR and pulmonary edema. Had rapid response called yesterday morning 08/28/17 due to A-fib rvr with rate in the 160's cardizem drip and heparin drip initiated. On IV antibiotics for CAP.  No acute events reported overnight. Patient was seen and examined at her bedside. She has no complaints at this time. Denies chest pain, dyspnea or palpitations. Heart rate is well controlled on po cardizem. Converted back to sinus rhythm. 12 leads ekg ordered. Started on xarelto. Cardiology followed.  Patient tolerated diltiazem po well. Heart rate has been stable and back to sinus rhythm. Patient is agreeable to going to SNF.  On the day of discharge the patient was hemodynamically stable. All questions answered to her and family members satisfaction. [facility]    Hospital Course:  Active Problems:   Acute  encephalopathy   HTN (hypertension)   Atrial fibrillation with RVR (HCC)   Hypokalemia   Hypomagnesemia  1. Metabolic encephalopathy, resolved -most likely 2/2 to sepsis, CAP, present on admission -continue IV ceftriaxone and IV azithromycin, day #5 -continue duonebs prn -continue O2 supplementation to maintain O2 sat 92% or greater -fall precaution -reorient as needed   2. Community acquired PNA, present on admission -management as stated above -swallow eval to r/o possible dysphagia regular thin liquid -ABG no sign of hypoxemia on 2L [place] -completed 6 days of IV antibiotics as stated above  3. New onset Afib with RvR -diltiazem drip stopped 08/31/17 -po diltiazem 240 mg daily -rate controlled -heparin drip stopped 08/31/17 -xarelto 15 mg daily -telemetry  -CHADSVASC score 5 -echo 11/28 EF 55-60% -cardiology following -we appreciate cardiology, Dr. Jacinto HalimGanji, recommendations  4. Ambulatory dysfunction -PT/OT -PT recommends SNF   5. Hypertension -losartan  -prn IV hydralazine for sbp >180 or dbp>105  6. HLD -simvastatin  7. Chronic normocytic anemia -No sign of active bleeding- -hg stable 9.4 -baseline hg 10 -CBC am  8. Elevated troponin -most likely 2/2 to afib rvr -peaked at 0.4 (08/28/17) trending down to 0.17 -No ST T elevation. EKG personally reviewed. -2D echo done today 08/29/17 LVEF 55-60%  9. Anxiety -stable -xanax  10. Restless leg syndrome -ropinirole  11. chronic constipation -sennokot BID  12. Hypokalemia, resolved -K+ 3.8 from 3.0 from 2.8 -BMP am    Procedures:  none  Consultations:  cardiology  Discharge Exam: BP 138/73 (BP Location: Right Arm)  Pulse 86   Temp 97.9 F (36.6 C) (Oral)   Resp (!) 21   Ht 5\' 4"  (1.626 m)   Wt 60.4 kg (133 lb 2.5 oz)   SpO2 95%   BMI 22.86 kg/m     General:  83 yo CF thin built laying in bed in NAD. Alert but confused  Cardiovascular: RRR with no rubs or gallops    Respiratory: Mild crackles at bases bilaterally. No wheezes noted  Abdomen: soft NT ND NBS x4 quadrants  Musculoskeletal: Moves all limbs. 2/4 pulses in all 4  Skin: No noted open lesions  Psychiatry: Mood is appropriate for condition and setting   Discharge Instructions You were cared for by a hospitalist during your hospital stay. If you have any questions about your discharge medications or the care you received while you were in the hospital after you are discharged, you can call the unit and asked to speak with the hospitalist on call if the hospitalist that took care of you is not available. Once you are discharged, your primary care physician will handle any further medical issues. Please note that NO REFILLS for any discharge medications will be authorized once you are discharged, as it is imperative that you return to your primary care physician (or establish a relationship with a primary care physician if you do not have one) for your aftercare needs so that they can reassess your need for medications and monitor your lab values.   Allergies as of 09/03/2017   No Known Allergies     Medication List    STOP taking these medications   cephALEXin 500 MG capsule Commonly known as:  KEFLEX     TAKE these medications   acetaminophen 500 MG tablet Commonly known as:  TYLENOL Take 1,000 mg by mouth every 6 (six) hours as needed for mild pain.   ALPRAZolam 0.25 MG tablet Commonly known as:  XANAX Take 0.125-0.25 mg by mouth See admin instructions. 0.125mg  in morning and afternoon, then 0.25mg  at bedtime   losartan 25 MG tablet Commonly known as:  COZAAR Take 25 mg by mouth daily.   rOPINIRole 1 MG tablet Commonly known as:  REQUIP Take 1 mg by mouth at bedtime.   simvastatin 20 MG tablet Commonly known as:  ZOCOR Take 20 mg by mouth daily.   traMADol 50 MG tablet Commonly known as:  ULTRAM Take 50 mg by mouth 2 (two) times daily.   valsartan 320 MG  tablet Commonly known as:  DIOVAN Take 320 mg by mouth daily.      No Known Allergies  Contact information for follow-up providers    Yates Decamp, MD. Call.   Specialty:  Cardiology Why:  To be seen in 3-4 weeks Contact information: [address] [place] [address] [phone]            Contact information for after-discharge care    Destination    [facility] .   Service:  Skilled Nursing Contact information: [address] [place] [address] [phone]                   The results of significant diagnostics from this hospitalization (including imaging, microbiology, ancillary and laboratory) are listed below for reference.    Significant Diagnostic Studies: Dg Chest 2 View  Result Date: 08/26/2017 CLINICAL DATA:  Weakness. EXAM: CHEST  2 VIEW COMPARISON:  08/04/2015 FINDINGS: Shallow inspiration with linear atelectasis in the lung bases. Cardiac enlargement  with diffuse pulmonary vascular congestion. Perihilar hazy infiltrates consistent with edema. No focal consolidation. No blunting of costophrenic angles. No pneumothorax. Degenerative changes in the spine. Calcification of the aorta. IMPRESSION: Shallow inspiration with atelectasis in the lung bases. Congestive changes with cardiac enlargement, pulmonary vascular congestion, and perihilar edema. Electronically Signed   By: Burman NievesWilliam  Stevens M.D.   On: 08/26/2017 05:42   Dg Cervical Spine 2 Or 3 Views  Result Date: 08/28/2017 CLINICAL DATA:  Back pain and bilateral leg weakness EXAM: CERVICAL SPINE - 2-3 VIEW COMPARISON:  None. FINDINGS: Study is degraded by a nonstandard positioning. Alignment of the upper cervical spine is normal. The cervical spine is adequately visualized only to the C5-6 space. There is multilevel facet arthrosis with fusion of most of the right-sided facets. The dens is intact. No prevertebral soft tissue swelling. IMPRESSION: Limited study of the  cervical spine due to nonstandard positioning. Only the C2-C6 levels are visualized. No fracture or static subluxation at these levels. Multilevel facet arthrosis with fusion of the right-sided facets. Electronically Signed   By: Deatra RobinsonKevin  Herman M.D.   On: 08/28/2017 05:18   Dg Thoracic Spine 2 View  Result Date: 08/28/2017 CLINICAL DATA:  Back pain and bilateral leg weakness EXAM: THORACIC SPINE 2 VIEWS COMPARISON:  None. FINDINGS: There is multilevel thoracic osteophytosis. There is no acute listhesis. The upper thoracic vertebrae are poorly visualized. No fracture is visible. IMPRESSION: Multilevel thoracic vertebral body height loss, favored to be chronic. However, the upper thoracic levels are poorly visualized. Electronically Signed   By: Deatra RobinsonKevin  Herman M.D.   On: 08/28/2017 05:23   Dg Lumbar Spine 2-3 Views  Result Date: 08/28/2017 CLINICAL DATA:  Back pain and bilateral leg weakness EXAM: LUMBAR SPINE - 2-3 VIEW COMPARISON:  Lumbar spine radiograph 05/29/2017 FINDINGS: There are 5 non-rib-bearing lumbar vertebral bodies. There is grade 1 L4-L5 anterolisthesis, unchanged. No acute fracture. There is mild multilevel vertebral body height loss. The upper lumbar disc spaces are preserved. Moderate L4-5 and L5-S1 disc space narrowing. IMPRESSION: No acute abnormality of the lumbar spine. Unchanged grade 1 L4-5 anterolisthesis. Electronically Signed   By: Deatra RobinsonKevin  Herman M.D.   On: 08/28/2017 05:17   Dg Pelvis 1-2 Views  Result Date: 08/28/2017 CLINICAL DATA:  Back pain and bilateral leg weakness EXAM: PELVIS - 1-2 VIEW COMPARISON:  None. FINDINGS: There is no acute fracture or diastasis of the pelvis. The joint spaces of the hips are preserved. There is diffuse osteopenia. No acute fracture or dislocation. IMPRESSION: No acute fracture or dislocation of the hips or pelvis. Electronically Signed   By: Deatra RobinsonKevin  Herman M.D.   On: 08/28/2017 05:19   Ct Head Wo Contrast  Result Date: 08/27/2017 CLINICAL  DATA:  UTI.  Mental status changes. EXAM: CT HEAD WITHOUT CONTRAST TECHNIQUE: Contiguous axial images were obtained from the base of the skull through the vertex without intravenous contrast. COMPARISON:  05/29/2017 FINDINGS: Brain: There is no evidence for acute hemorrhage, hydrocephalus, mass lesion, or abnormal extra-axial fluid collection. No definite CT evidence for acute infarction. Diffuse loss of parenchymal volume is consistent with atrophy. Patchy low attenuation in the deep hemispheric and periventricular white matter is nonspecific, but likely reflects chronic microvascular ischemic demyelination. Vascular: No hyperdense vessel or unexpected calcification. Skull: No evidence for fracture. No worrisome lytic or sclerotic lesion. Visualized Sinuses/Orbits: The visualized paranasal sinuses and mastoid air cells are clear. Visualized portions of the globes and intraorbital fat are unremarkable. Other: None. IMPRESSION: 1. No acute intracranial  abnormality. 2. Atrophy with chronic small vessel white matter ischemic disease. Electronically Signed   By: Kennith Center M.D.   On: 08/27/2017 18:59   Mr Brain Wo Contrast  Result Date: 08/29/2017 CLINICAL DATA:  Altered mental status EXAM: MRI HEAD WITHOUT CONTRAST TECHNIQUE: Multiplanar, multiecho pulse sequences of the brain and surrounding structures were obtained without intravenous contrast. COMPARISON:  Head CT 08/27/2017 FINDINGS: Brain: The midline structures are normal. There is no acute infarct or acute hemorrhage. No mass lesion, hydrocephalus, dural abnormality or extra-axial collection. Very mild periventricular white matter hyperintensity. There is generalized atrophy, but not particularly age advanced and with no lobar predilection. No chronic microhemorrhage or superficial siderosis. Vascular: Major intracranial arterial and venous sinus flow voids are preserved. Skull and upper cervical spine: The visualized skull base, calvarium, upper  cervical spine and extracranial soft tissues are normal. Sinuses/Orbits: No fluid levels or advanced mucosal thickening. No mastoid or middle ear effusion. Normal orbits. IMPRESSION: Mild atrophy for age.  No acute abnormality. Electronically Signed   By: Deatra Robinson M.D.   On: 08/29/2017 18:57   Dg Chest Port 1 View  Result Date: 08/27/2017 CLINICAL DATA:  83 y/o  F; hypoxia. EXAM: PORTABLE CHEST 1 VIEW COMPARISON:  08/26/2017 chest radiograph FINDINGS: Normal cardiac silhouette. Aortic atherosclerosis with calcification. These hazy opacification of the lungs and prominent reticular markings. No pleural effusion or pneumothorax. Multilevel degenerative changes of the spine. No acute osseous abnormality identified. IMPRESSION: Diffuse hazy opacification of lungs and reticular markings probably representing pulmonary edema. Underlying pneumonia not excluded. Electronically Signed   By: Mitzi Hansen M.D.   On: 08/27/2017 20:03    Microbiology: Recent Results (from the past 240 hour(s))  Urine culture     Status: None   Collection Time: 08/27/17 10:25 PM  Result Value Ref Range Status   Specimen Description URINE, CATHETERIZED  Final   Special Requests NONE  Final   Culture NO GROWTH  Final   Report Status 08/29/2017 FINAL  Final     Labs: Basic Metabolic Panel: Recent Labs  Lab 08/28/17 1003 08/28/17 1510 08/28/17 1648 08/29/17 0341 08/30/17 0226 08/31/17 0242 09/01/17 0252 09/03/17 0705  NA  --  NOT DONE 138 138 140 137 137 140  K  --  NOT DONE 3.3* 2.8* 3.0* 3.8 4.1 3.8  CL  --  NOT DONE 106 106 105 106 105 107  CO2  --  NOT DONE 22 24 27 26 23 26   GLUCOSE  --  134* 195* 135* 127* 134* 120* 135*  BUN  --  NOT DONE 24* 23* 16 18 19 19   CREATININE  --  0.93 0.92 0.74 0.73 0.76 0.71 0.75  CALCIUM  --  NOT DONE 8.4* 8.5* 8.4* 8.7* 9.0 9.1  MG 1.5* NOT DONE 1.8  --   --   --   --   --    Liver Function Tests: Recent Labs  Lab 08/27/17 1800 08/28/17 0555  AST 49*  43*  ALT 23 32  ALKPHOS 80 76  BILITOT 2.2* 0.5  PROT 6.1* 6.4*  ALBUMIN 2.6* 2.5*   No results for input(s): LIPASE, AMYLASE in the last 168 hours. Recent Labs  Lab 08/27/17 2149  AMMONIA 24   CBC: Recent Labs  Lab 08/27/17 1800  08/29/17 0341 08/30/17 0226 08/31/17 0242 09/01/17 0252 09/03/17 0705  WBC 11.1*   < > 8.7 7.9 8.9 9.0 8.3  NEUTROABS 8.3*  --   --   --   --   --   --  HGB 11.6*   < > 9.8* 9.7* 9.4* 10.0* 9.4*  HCT 34.8*   < > 30.1* 29.0* 29.2* 31.3* 29.6*  MCV 91.6   < > 90.7 91.2 91.5 92.9 93.1  PLT 205   < > 193 217 234 262 350   < > = values in this interval not displayed.   Cardiac Enzymes: Recent Labs  Lab 08/28/17 0555 08/28/17 1003 08/28/17 1510 08/29/17 0801  TROPONINI 0.06* 0.07* 0.42* 0.17*   BNP: BNP (last 3 results) Recent Labs    08/27/17 2031  BNP 555.4*    ProBNP (last 3 results) No results for input(s): PROBNP in the last 8760 hours.  CBG: Recent Labs  Lab 09/01/17 1106 09/01/17 2114 09/02/17 0044 09/02/17 0623 09/02/17 1118  GLUCAP 117* 158* 123* 107* 107*       Signed:  Darlin Drop, MD [facility] 09/03/2017, 1:56 PM

## 2018-04-22 IMAGING — DX DG CERVICAL SPINE 2 OR 3 VIEWS
3 series · 3 of 3 positions shown · non-contrast
Comparison: None.

CLINICAL DATA: Back pain and bilateral leg weakness

EXAM:
CERVICAL SPINE - 2-3 VIEW

[c-spine lat]
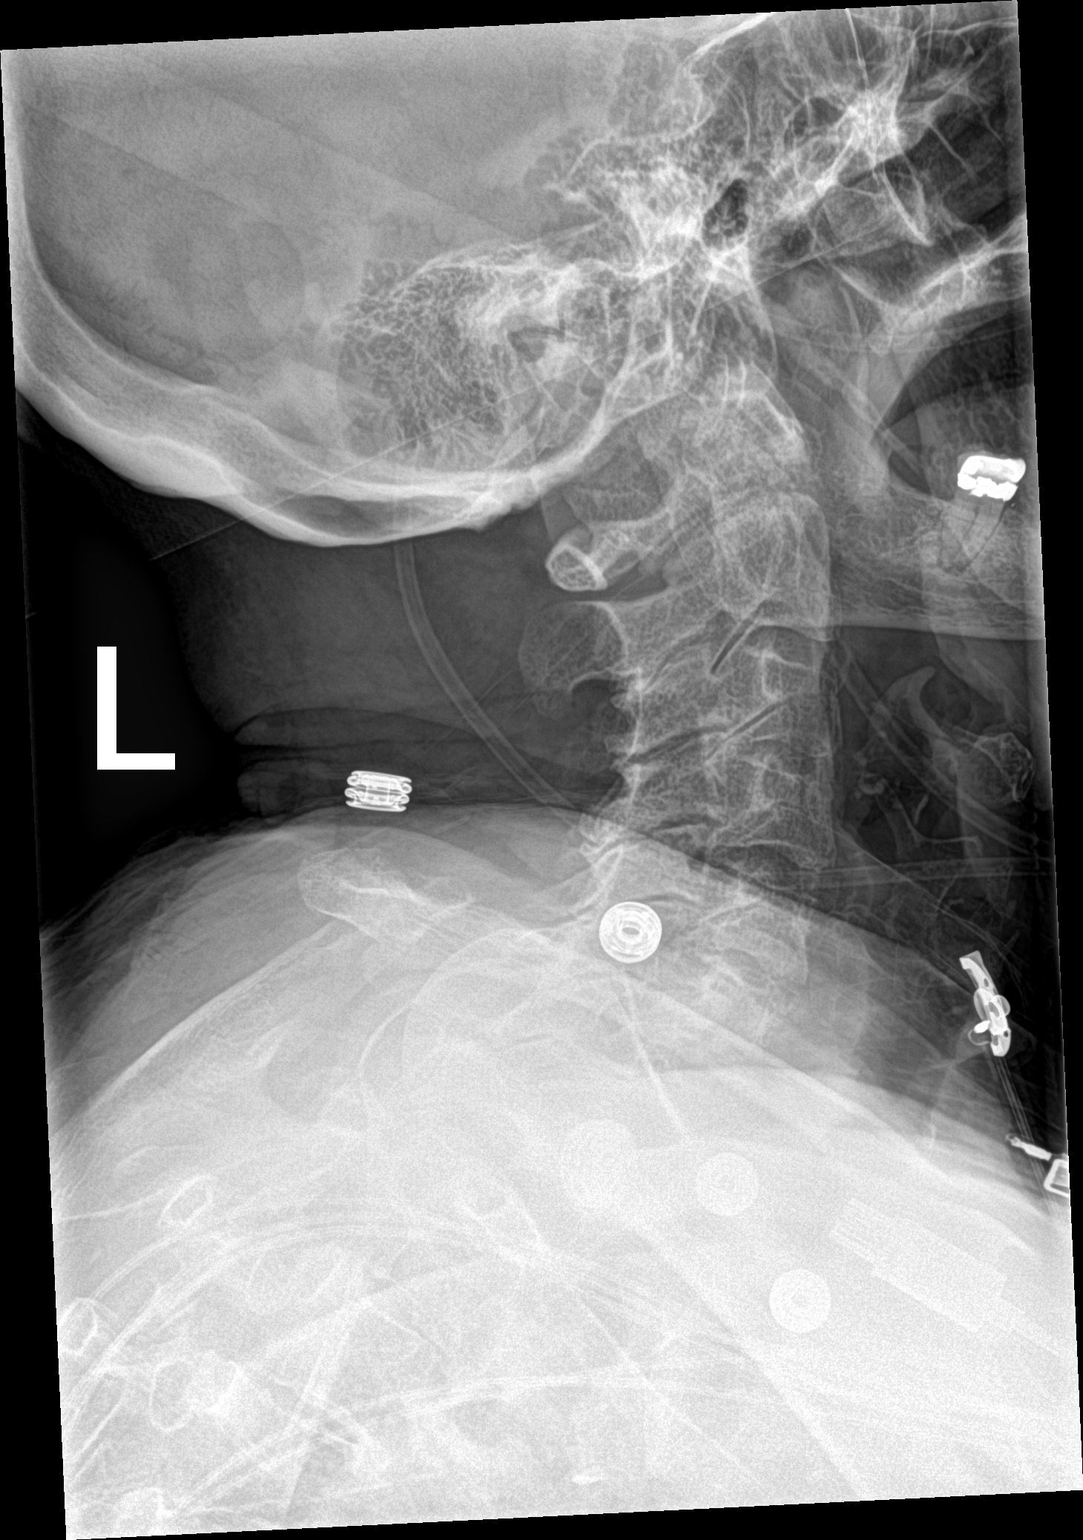

[c-spine ap]
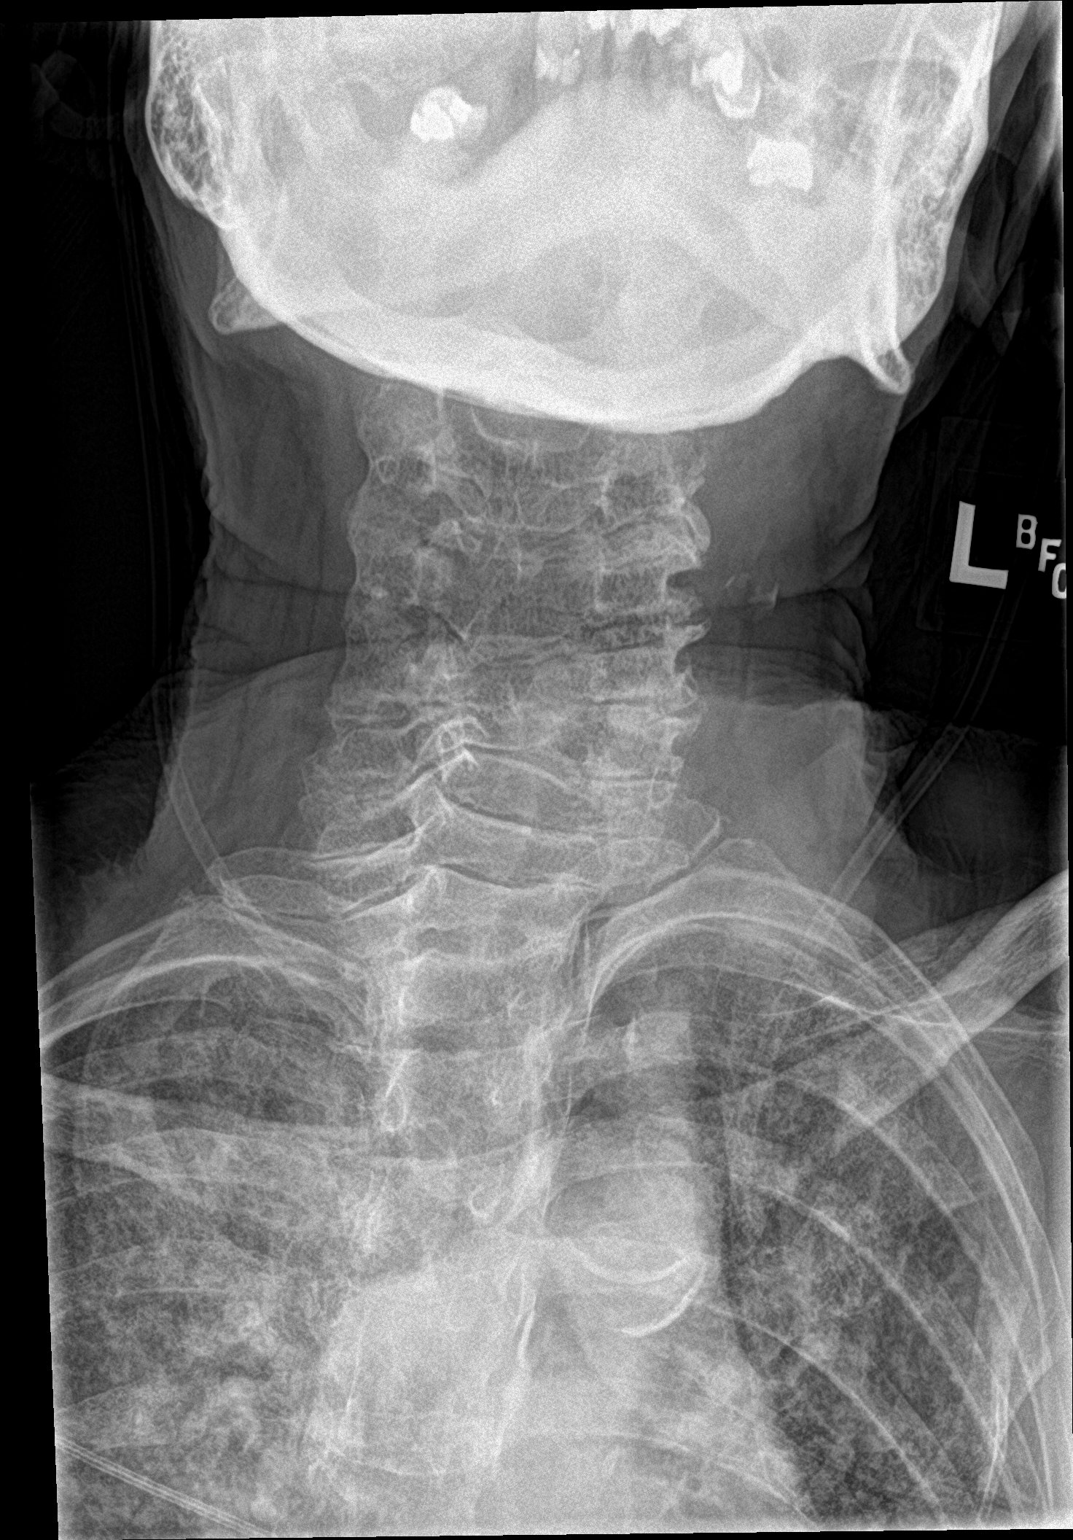

[c-spine open mouth]
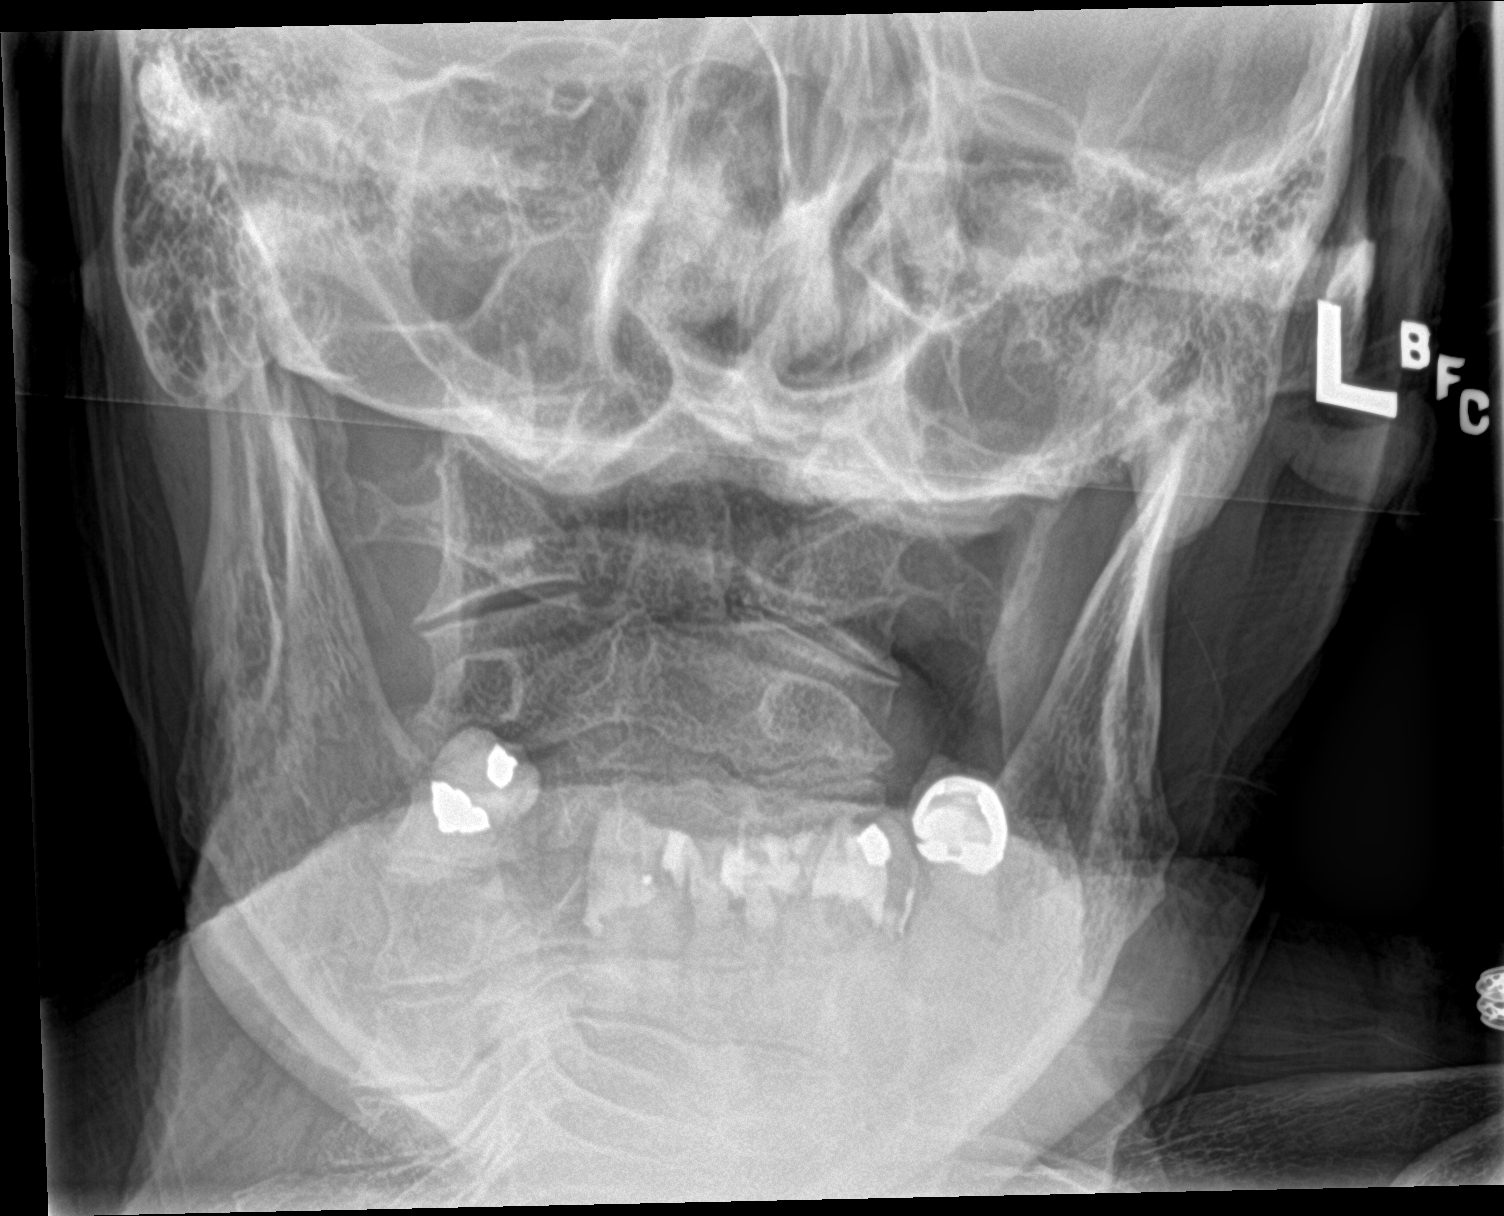

[3 of 3 positions shown; findings below may reference images not displayed]

FINDINGS: Study is degraded by a nonstandard positioning. Alignment of the
upper cervical spine is normal. The cervical spine is adequately
visualized only to the C5-6 space. There is multilevel facet
arthrosis with fusion of most of the right-sided facets. The dens is
intact. No prevertebral soft tissue swelling.
IMPRESSION: Limited study of the cervical spine due to nonstandard positioning.
Only the C2-C6 levels are visualized. No fracture or static
subluxation at these levels. Multilevel facet arthrosis with fusion
of the right-sided facets.

## 2019-07-03 DEATH — deceased
